# Patient Record
Sex: Female | Born: 1980 | Race: Black or African American | Hispanic: No | Marital: Single | Smoking: Current some day smoker
Health system: Southern US, Community
[De-identification: ages and names within clinical notes are randomized; demographics above are authoritative.]

## PROBLEM LIST (undated history)

## (undated) HISTORY — PX: UTERINE FIBROID SURGERY: SHX826

---

## 2005-08-20 ENCOUNTER — Emergency Department (HOSPITAL_COMMUNITY): Admission: EM | Admit: 2005-08-20 | Discharge: 2005-08-20 | Payer: Self-pay | Admitting: Emergency Medicine

## 2005-08-26 ENCOUNTER — Emergency Department (HOSPITAL_COMMUNITY): Admission: EM | Admit: 2005-08-26 | Discharge: 2005-08-26 | Payer: Self-pay | Admitting: Family Medicine

## 2014-12-18 ENCOUNTER — Emergency Department
Admission: EM | Admit: 2014-12-18 | Discharge: 2014-12-18 | Disposition: A | Discharge status: Home / Self Care | Payer: Self-pay | Attending: Student | Admitting: Student

## 2014-12-18 DIAGNOSIS — Z4802 Encounter for removal of sutures: Secondary | ICD-10-CM | POA: Insufficient documentation

## 2014-12-18 NOTE — ED Notes (Signed)
Patient presents to the ED for suture removal.  Patient had sutures placed on 7/8 to her left eyebrow.  Patient is in no obvious distress at this time.

## 2014-12-18 NOTE — ED Provider Notes (Signed)
[facility] Emergency Department Provider Note ____________________________________________  Time seen: Approximately 2:17 PM  I have reviewed the triage vital signs and the nursing notes.   HISTORY  Chief Complaint Suture / Staple Removal   HPI Sharon Schmitt is a 34 y.o. female who presents to the emergency department for suture removal. Sutures were placed in left eyebrow on 12/05/2014.   No past medical history on file.  There are no active problems to display for this patient.   No past surgical history on file.  No current outpatient prescriptions on file.  Allergies Review of patient's allergies indicates no known allergies.  No family history on file.  Social History History  Substance Use Topics  . Smoking status: Not on file  . Smokeless tobacco: Not on file  . Alcohol Use: Not on file    Review of Systems   Constitutional: No fever/chills Eyes: No visual changes. ENT: No congestion or rhinorrhea Cardiovascular: Denies chest pain. Respiratory: Denies shortness of breath. Gastrointestinal: No abdominal pain.  No nausea, no vomiting.  No diarrhea.  No constipation. Genitourinary: Negative for dysuria. Musculoskeletal: Negative for back pain. Skin: Sutures in left eyebrow Neurological: Negative for headaches, focal weakness or numbness.  10-point ROS otherwise negative.  ____________________________________________   PHYSICAL EXAM:  VITAL SIGNS: ED Triage Vitals  Enc Vitals Group     BP 12/18/14 1401 123/79 mmHg     Pulse Rate 12/18/14 1401 83     Resp 12/18/14 1401 16     Temp 12/18/14 1401 98.6 F (37 C)     Temp Source 12/18/14 1401 Oral     SpO2 12/18/14 1401 96 %     Weight 12/18/14 1401 136 lb 8 oz (61.916 kg)     Height 12/18/14 1401 5' 6.5" (1.689 m)     Head Cir --      Peak Flow --      Pain Score 12/18/14 1402 7     Pain Loc --      Pain Edu? --      Excl. in GC? --     Constitutional: Alert and  oriented. Well appearing and in no acute distress. Eyes: Conjunctivae are normal. PERRL. EOMI. Head: Atraumatic. Nose: No congestion/rhinnorhea. Mouth/Throat: Mucous membranes are moist. Neck: No stridor. Cardiovascular: Normal rate, regular rhythm.  Good peripheral circulation. Respiratory: Normal respiratory effort.   Gastrointestinal: Soft and nontender. No distention. No abdominal bruits.  Musculoskeletal: No lower extremity tenderness nor edema.  No joint effusions. Neurologic:  Normal speech and language. No gross focal neurologic deficits are appreciated. Speech is normal. No gait instability. Skin:  Rash no; Erythema no; Negative for petechiae.  Psychiatric: Mood and affect are normal. Speech and behavior are normal.  ____________________________________________   LABS (all labs ordered are listed, but only abnormal results are displayed)  Labs Reviewed - No data to display ____________________________________________  EKG   ____________________________________________  RADIOLOGY   ____________________________________________   PROCEDURES  Procedure(s) performed: Sutures removed by RN ____________________________________________   INITIAL IMPRESSION / ASSESSMENT AND PLAN / ED COURSE  Pertinent labs & imaging results that were available during my care of the patient were reviewed by me and considered in my medical decision making (see chart for details).  Patient was advised to take her antibiotic as prescribed. She was advise to return to the ER for symptoms that change or worsen if unable to schedule an appointment. ____________________________________________   FINAL CLINICAL IMPRESSION(S) / ED DIAGNOSES  Final diagnoses:  Visit for suture removal       Chinita Pester, FNP 12/18/14 1425  Gayla Doss, MD 12/18/14 1525

## 2014-12-18 NOTE — Discharge Instructions (Signed)

## 2015-04-05 ENCOUNTER — Emergency Department: Payer: Self-pay

## 2015-04-05 ENCOUNTER — Emergency Department
Admission: EM | Admit: 2015-04-05 | Discharge: 2015-04-05 | Disposition: A | Discharge status: Home / Self Care | Payer: Self-pay | Attending: Emergency Medicine | Admitting: Emergency Medicine

## 2015-04-05 ENCOUNTER — Encounter: Payer: Self-pay | Admitting: Emergency Medicine

## 2015-04-05 DIAGNOSIS — Y9389 Activity, other specified: Secondary | ICD-10-CM | POA: Insufficient documentation

## 2015-04-05 DIAGNOSIS — S20211A Contusion of right front wall of thorax, initial encounter: Secondary | ICD-10-CM | POA: Insufficient documentation

## 2015-04-05 DIAGNOSIS — Y9289 Other specified places as the place of occurrence of the external cause: Secondary | ICD-10-CM | POA: Insufficient documentation

## 2015-04-05 DIAGNOSIS — Y998 Other external cause status: Secondary | ICD-10-CM | POA: Insufficient documentation

## 2015-04-05 MED ORDER — HYDROCODONE-ACETAMINOPHEN 5-325 MG PO TABS: 1.0000 | ORAL_TABLET | ORAL | Status: DC | PRN

## 2015-04-05 MED ORDER — HYDROCODONE-ACETAMINOPHEN 5-325 MG PO TABS
2.0000 | ORAL_TABLET | Freq: Once | ORAL | Status: AC
  Administered 2015-04-05: 2 via ORAL
  Filled 2015-04-05: qty 2

## 2015-04-05 MED ORDER — NAPROXEN 500 MG PO TBEC: 500.0000 mg | DELAYED_RELEASE_TABLET | Freq: Two times a day (BID) | ORAL | Status: DC

## 2015-04-05 MED ORDER — CYCLOBENZAPRINE HCL 5 MG PO TABS: 5.0000 mg | ORAL_TABLET | Freq: Three times a day (TID) | ORAL | Status: DC | PRN

## 2015-04-05 NOTE — ED Notes (Signed)
MD at bedside. 

## 2015-04-05 NOTE — Discharge Instructions (Signed)
Chest Contusion A contusion is a deep bruise. Bruises happen when an injury causes bleeding under the skin. Signs of bruising include pain, puffiness (swelling), and discolored skin. The bruise may turn blue, purple, or yellow.  HOME CARE  Put ice on the injured area.  Put ice in a plastic bag.  Place a towel between the skin and the bag.  Leave the ice on for 15-20 minutes at a time, 03-04 times a day for the first 48 hours.  Only take medicine as told by your doctor.  Rest.  Take deep breaths (deep-breathing exercises) as told by your doctor.  Stop smoking if you smoke.  Do not lift objects over 5 pounds (2.3 kilograms) for 3 days or longer if told by your doctor. GET HELP RIGHT AWAY IF:   You have more bruising or puffiness.  You have pain that gets worse.  You have trouble breathing.  You are dizzy, weak, or pass out (faint).  You have blood in your pee (urine) or poop (stool).  You cough up or throw up (vomit) blood.  Your puffiness or pain is not helped with medicines. MAKE SURE YOU:   Understand these instructions.  Will watch your condition.  Will get help right away if you are not doing well or get worse.   This information is not intended to replace advice given to you by your health care provider. Make sure you discuss any questions you have with your health care provider.   Document Released: 11/02/2007 Document Revised: 02/08/2012 Document Reviewed: 11/07/2011 Elsevier Interactive Patient Education 2016 [place].  Rib Contusion A rib contusion is a deep bruise on your rib area. Contusions are the result of a blunt trauma that causes bleeding and injury to the tissues under the skin. A rib contusion may involve bruising of the ribs and of the skin and muscles in the area. The skin overlying the contusion may turn blue, purple, or yellow. Minor injuries will give you a painless contusion, but more severe contusions may stay painful and swollen for a  few weeks. CAUSES  A contusion is usually caused by a blow, trauma, or direct force to an area of the body. This often occurs while playing contact sports. SYMPTOMS  Swelling and redness of the injured area.  Discoloration of the injured area.  Tenderness and soreness of the injured area.  Pain with or without movement. DIAGNOSIS  The diagnosis can be made by taking a medical history and performing a physical exam. An X-ray, CT scan, or MRI may be needed to determine if there were any associated injuries, such as broken bones (fractures) or internal injuries. TREATMENT  Often, the best treatment for a rib contusion is rest. Icing or applying cold compresses to the injured area may help reduce swelling and inflammation. Deep breathing exercises may be recommended to reduce the risk of partial lung collapse and pneumonia. Over-the-counter or prescription medicines may also be recommended for pain control. HOME CARE INSTRUCTIONS   Apply ice to the injured area:  Put ice in a plastic bag.  Place a towel between your skin and the bag.  Leave the ice on for 20 minutes, 2-3 times per day.  Take medicines only as directed by your health care provider.  Rest the injured area. Avoid strenuous activity and any activities or movements that cause pain. Be careful during activities and avoid bumping the injured area.  Perform deep-breathing exercises as directed by your health care provider.  Do not lift  anything that is heavier than 5 lb (2.3 kg) until your health care provider approves.  Do not use any tobacco products, including cigarettes, chewing tobacco, or electronic cigarettes. If you need help quitting, ask your health care provider. SEEK MEDICAL CARE IF:   You have increased bruising or swelling.  You have pain that is not controlled with treatment.  You have a fever. SEEK IMMEDIATE MEDICAL CARE IF:   You have difficulty breathing or shortness of breath.  You develop a  continual cough, or you cough up thick or bloody sputum.  You feel sick to your stomach (nauseous), you throw up (vomit), or you have abdominal pain.   This information is not intended to replace advice given to you by your health care provider. Make sure you discuss any questions you have with your health care provider.   Document Released: 02/08/2001 Document Revised: 06/06/2014 Document Reviewed: 02/25/2014 Elsevier Interactive Patient Education [employer]
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
## 2015-04-05 NOTE — ED Notes (Signed)
Patient transported to X-ray 

## 2016-02-13 ENCOUNTER — Emergency Department
Admission: EM | Admit: 2016-02-13 | Discharge: 2016-02-13 | Disposition: A | Discharge status: Home / Self Care | Payer: Self-pay | Attending: Student in an Organized Health Care Education/Training Program | Admitting: Student in an Organized Health Care Education/Training Program

## 2016-02-13 ENCOUNTER — Emergency Department: Payer: Self-pay

## 2016-02-13 ENCOUNTER — Encounter: Payer: Self-pay | Admitting: Emergency Medicine

## 2016-02-13 DIAGNOSIS — R1900 Intra-abdominal and pelvic swelling, mass and lump, unspecified site: Secondary | ICD-10-CM

## 2016-02-13 DIAGNOSIS — Y9389 Activity, other specified: Secondary | ICD-10-CM | POA: Insufficient documentation

## 2016-02-13 DIAGNOSIS — W19XXXA Unspecified fall, initial encounter: Secondary | ICD-10-CM

## 2016-02-13 DIAGNOSIS — R1909 Other intra-abdominal and pelvic swelling, mass and lump: Secondary | ICD-10-CM | POA: Insufficient documentation

## 2016-02-13 DIAGNOSIS — W1839XA Other fall on same level, initial encounter: Secondary | ICD-10-CM | POA: Insufficient documentation

## 2016-02-13 DIAGNOSIS — R0789 Other chest pain: Secondary | ICD-10-CM | POA: Insufficient documentation

## 2016-02-13 DIAGNOSIS — Y9289 Other specified places as the place of occurrence of the external cause: Secondary | ICD-10-CM | POA: Insufficient documentation

## 2016-02-13 DIAGNOSIS — Y999 Unspecified external cause status: Secondary | ICD-10-CM | POA: Insufficient documentation

## 2016-02-13 DIAGNOSIS — F17200 Nicotine dependence, unspecified, uncomplicated: Secondary | ICD-10-CM | POA: Insufficient documentation

## 2016-02-13 LAB — CBC
HCT: 39.4 % (ref 35.0–47.0)
Hemoglobin: 13.5 g/dL (ref 12.0–16.0)
MCH: 31.1 pg (ref 26.0–34.0)
MCHC: 34.4 g/dL (ref 32.0–36.0)
MCV: 90.6 fL (ref 80.0–100.0)
PLATELETS: 234 10*3/uL (ref 150–440)
RBC: 4.35 MIL/uL (ref 3.80–5.20)
RDW: 13.2 % (ref 11.5–14.5)
WBC: 5.8 10*3/uL (ref 3.6–11.0)

## 2016-02-13 LAB — URINALYSIS COMPLETE WITH MICROSCOPIC (ARMC ONLY)
BILIRUBIN URINE: NEGATIVE
Bacteria, UA: NONE SEEN
GLUCOSE, UA: NEGATIVE mg/dL
Hgb urine dipstick: NEGATIVE
Ketones, ur: NEGATIVE mg/dL
Leukocytes, UA: NEGATIVE
Nitrite: NEGATIVE
Protein, ur: NEGATIVE mg/dL
Specific Gravity, Urine: 1.019 (ref 1.005–1.030)
pH: 5 (ref 5.0–8.0)

## 2016-02-13 LAB — LIPASE, BLOOD: LIPASE: 28 U/L (ref 11–51)

## 2016-02-13 LAB — COMPREHENSIVE METABOLIC PANEL
ALK PHOS: 41 U/L (ref 38–126)
ALT: 13 U/L — AB (ref 14–54)
AST: 21 U/L (ref 15–41)
Albumin: 3.7 g/dL (ref 3.5–5.0)
Anion gap: 5 (ref 5–15)
BUN: 12 mg/dL (ref 6–20)
CHLORIDE: 106 mmol/L (ref 101–111)
CO2: 25 mmol/L (ref 22–32)
CREATININE: 0.75 mg/dL (ref 0.44–1.00)
Calcium: 8.9 mg/dL (ref 8.9–10.3)
GFR calc Af Amer: >60 mL/min (ref >60)
Glucose, Bld: 99 mg/dL (ref 65–99)
Potassium: 4 mmol/L (ref 3.5–5.1)
SODIUM: 136 mmol/L (ref 135–145)
Total Bilirubin: 0.2 mg/dL — ABNORMAL LOW (ref 0.3–1.2)
Total Protein: 7.2 g/dL (ref 6.5–8.1)

## 2016-02-13 LAB — POCT PREGNANCY, URINE: Preg Test, Ur: NEGATIVE

## 2016-02-13 MED ORDER — NAPROXEN 500 MG PO TABS: 500.0000 mg | ORAL_TABLET | Freq: Two times a day (BID) | ORAL | 0 refills | Status: DC

## 2016-02-13 MED ORDER — FENTANYL CITRATE (PF) 100 MCG/2ML IJ SOLN
100.0000 ug | INTRAMUSCULAR | Status: DC | PRN
  Administered 2016-02-13: 100 ug via INTRAVENOUS
  Filled 2016-02-13: qty 2

## 2016-02-13 MED ORDER — HYDROCODONE-ACETAMINOPHEN 5-325 MG PO TABS: 1.0000 | ORAL_TABLET | ORAL | 0 refills | Status: DC | PRN

## 2016-02-13 MED ORDER — SODIUM CHLORIDE 0.9 % IV BOLUS (SEPSIS)
1000.0000 mL | Freq: Once | INTRAVENOUS | Status: AC
  Administered 2016-02-13: 1000 mL via INTRAVENOUS

## 2016-02-13 MED ORDER — PROMETHAZINE HCL 12.5 MG PO TABS: 12.5000 mg | ORAL_TABLET | Freq: Four times a day (QID) | ORAL | 0 refills | Status: DC | PRN

## 2016-02-13 MED ORDER — IOPAMIDOL (ISOVUE-300) INJECTION 61%
100.0000 mL | Freq: Once | INTRAVENOUS | Status: AC | PRN
  Administered 2016-02-13: 100 mL via INTRAVENOUS

## 2016-02-13 MED ORDER — PROMETHAZINE HCL 25 MG/ML IJ SOLN
12.5000 mg | Freq: Once | INTRAMUSCULAR | Status: AC
  Administered 2016-02-13: 12.5 mg via INTRAVENOUS
  Filled 2016-02-13: qty 1

## 2016-02-13 MED ORDER — IOPAMIDOL (ISOVUE-300) INJECTION 61%: 30.0000 mL | Freq: Once | INTRAVENOUS | Status: DC | PRN

## 2016-02-13 NOTE — ED Notes (Signed)
Pt states intense pain in posterior rib area as well as when she breathes.  No visual deformity noted, but pt states that it is tender to the touch.

## 2016-02-13 NOTE — ED Triage Notes (Signed)
Abdominal bloating x 1 year.

## 2016-02-13 NOTE — ED Triage Notes (Signed)
Fell 2 days ago, hit corner edge of couch with R side, R chest wall pain, esp with cough or inspiration. States feels like she broke a rib.

## 2016-02-13 NOTE — Discharge Instructions (Signed)
Please be sure to follow-up with her GYN doctor at [place] or at one of the local clinics. Please return for worsening pain.
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
## 2016-04-04 ENCOUNTER — Encounter: Payer: Self-pay | Admitting: Medical Oncology

## 2016-04-04 ENCOUNTER — Emergency Department
Admission: EM | Admit: 2016-04-04 | Discharge: 2016-04-04 | Disposition: A | Discharge status: Home / Self Care | Payer: Self-pay | Attending: Emergency Medicine | Admitting: Emergency Medicine

## 2016-04-04 DIAGNOSIS — L0231 Cutaneous abscess of buttock: Secondary | ICD-10-CM | POA: Insufficient documentation

## 2016-04-04 DIAGNOSIS — F17200 Nicotine dependence, unspecified, uncomplicated: Secondary | ICD-10-CM | POA: Insufficient documentation

## 2016-04-04 MED ORDER — HYDROCODONE-ACETAMINOPHEN 5-325 MG PO TABS: 1.0000 | ORAL_TABLET | ORAL | 0 refills | Status: DC | PRN

## 2016-04-04 MED ORDER — SULFAMETHOXAZOLE-TRIMETHOPRIM 800-160 MG PO TABS: 1.0000 | ORAL_TABLET | Freq: Two times a day (BID) | ORAL | 0 refills | Status: DC

## 2016-04-04 MED ORDER — LIDOCAINE HCL (PF) 1 % IJ SOLN
5.0000 mL | Freq: Once | INTRAMUSCULAR | Status: DC
  Filled 2016-04-04: qty 5

## 2016-04-04 NOTE — ED Notes (Signed)
Dry dressing applied to buttocks  Tolerated well

## 2016-04-04 NOTE — ED Provider Notes (Signed)
[facility] Emergency Department Provider Note  ____________________________________________   First MD Initiated Contact with Patient 04/04/16 [phone]     (approximate)  I have reviewed the triage vital signs and the nursing notes.   HISTORY  Chief Complaint Abscess    HPI Sharon Schmitt is a 35 y.o. female that presents with swelling and constant pain over the left buttocks that started a day and a half ago. Area is tender to touch. Patient has not noticed any drainage. The patient has not had any trauma to the area. Patient has not had anything like this before.  Patient denies fever or chills. Patient does not have any allergies.  Patient has been taking ibuprofen for pain.   No past medical history on file.  There are no active problems to display for this patient.   History reviewed. No pertinent surgical history.  Prior to Admission medications   Medication Sig Start Date End Date Taking? Authorizing Provider  HYDROcodone-acetaminophen (NORCO/VICODIN) 5-325 MG tablet Take 1 tablet by mouth every 4 (four) hours as needed for moderate pain. 04/04/16   Tommi Rumpshonda L Summers, PA-C  sulfamethoxazole-trimethoprim (BACTRIM DS,SEPTRA DS) 800-160 MG tablet Take 1 tablet by mouth 2 (two) times daily. 04/04/16   Tommi Rumpshonda L Summers, PA-C    Allergies Patient has no known allergies.  No family history on file.  Social History Social History  Substance Use Topics  . Smoking status: Current Some Day Smoker  . Smokeless tobacco: Not on file  . Alcohol use Yes    Review of Systems  Constitutional: No fever/chills/night sweats. Gastrointestinal:  No nausea, no vomiting. No diarrhea or constipation.  Skin: No other rashes. Neurological: No leg weakness or numbness. Allergic/Immunilogical: No allergies 10-point ROS otherwise negative.  ____________________________________________   PHYSICAL EXAM:  VITAL SIGNS: ED Triage Vitals  Enc Vitals Group   BP 04/04/16 0710 130/83     Pulse Rate 04/04/16 0710 89     Resp 04/04/16 0710 18     Temp 04/04/16 0710 97.6 F (36.4 C)     Temp Source 04/04/16 0710 Oral     SpO2 04/04/16 0710 97 %     Weight 04/04/16 0707 143 lb (64.9 kg)     Height 04/04/16 0707 5\' 6"  (1.676 m)     Head Circumference --      Peak Flow --      Pain Score 04/04/16 0707 8     Pain Loc --      Pain Edu? --      Excl. in GC? --     Constitutional: Alert and oriented. Well appearing and in no acute distress. Eyes: Conjunctivae are normal. PERRL. EOMI. Head: Atraumatic. Nose: No congestion/rhinnorhea. Mouth/Throat: Mucous membranes are moist.  Oropharynx non-erythematous. Neck: No stridor.   Cardiovascular: Good peripheral circulation. Respiratory: Normal respiratory effort.  No retractions.  Gastrointestinal: Soft and nontender. No distention. Musculoskeletal: No lower extremity tenderness nor edema.  No joint effusions. Neurologic:  Normal speech and language. No gross focal neurologic deficits are appreciated. No gait instability. Skin:  Skin is warm, dry and intact. 4 cm area of erythema and swelling over left buttocks. Extreme tenderness to palpation. Psychiatric: Mood and affect are normal. Speech and behavior are normal.  ____________________________________________   PROCEDURES  Procedure(s) performed: INCISION AND DRAINAGE Performed by: Tommi Rumpshonda L Summers Consent: Verbal consent obtained. Risks and benefits: risks, benefits and alternatives were discussed Type: abscess  Body area: left buttocks  Anesthesia: local infiltration  Incision  was made with a scalpel.  Local anesthetic: lidocaine 1% without epinephrine  Anesthetic total: 3 ml  Complexity: complex Blunt dissection to break up loculations  Drainage: purulent  Drainage amount: 2 ml  Packing material: 1/4 in iodoform gauze  Patient tolerance: Patient tolerated the procedure well with no immediate  complications.   Procedures  Critical Care performed: No  ____________________________________________   INITIAL IMPRESSION / ASSESSMENT AND PLAN / ED COURSE  Pertinent labs & imaging results that were available during my care of the patient were reviewed by me and considered in my medical decision making (see chart for details).    Clinical Course     Assessment: Abscess  Given the duration of symptoms and the extent of erythema, swelling, and tenderness my assessment is that the patient has an abscess. Patient denies any trauma to the area. Patient could have an insect bite but does not recall being bitten.   Plan: Abscess was be incised and drained in ED and covered with dressing. Abscess was packed with iodoform gauze, which should be removed in 2 days. Patient should take 1 tablet Bactrim twice daily for 10 days. Patient can take Vicodin 1 tablet every 4 hours as needed for pain. Return if symptoms worsen.  Enid DerryAshley Wagner PA-C   ____________________________________________   FINAL CLINICAL IMPRESSION(S) / ED DIAGNOSES  Final diagnoses:  Abscess of buttock, left      NEW MEDICATIONS STARTED DURING THIS VISIT:  Discharge Medication List as of 04/04/2016  8:57 AM    START taking these medications   Details  sulfamethoxazole-trimethoprim (BACTRIM DS,SEPTRA DS) 800-160 MG tablet Take 1 tablet by mouth 2 (two) times daily., Starting Mon 04/04/2016, Print         Note:  This document was prepared using Dragon voice recognition software and may include unintentional dictation errors.    Tommi Rumpshonda L Summers, PA-C 04/04/16 1202    Emily FilbertJonathan E Williams, MD 04/04/16 [phone]

## 2016-04-04 NOTE — Discharge Instructions (Signed)
Return to the emergency room or urgent care for removal of the drain in 2 days. Take medication as directed. Take Septra DS twice a day for 10 days. Take Norco only as needed for pain.

## 2016-04-04 NOTE — ED Triage Notes (Signed)
Pt reports abscess to left buttock x 2 days.  

## 2016-04-04 NOTE — ED Notes (Addendum)
States possible insect bite to buttocks couple of days ago  Area is larger and having increased pain .Marland Kitchen.left buttock red and swollen

## 2016-04-06 ENCOUNTER — Emergency Department
Admission: EM | Admit: 2016-04-06 | Discharge: 2016-04-06 | Disposition: A | Discharge status: Home / Self Care | Payer: Self-pay | Attending: Emergency Medicine | Admitting: Emergency Medicine

## 2016-04-06 ENCOUNTER — Encounter: Payer: Self-pay | Admitting: Emergency Medicine

## 2016-04-06 DIAGNOSIS — Z09 Encounter for follow-up examination after completed treatment for conditions other than malignant neoplasm: Secondary | ICD-10-CM

## 2016-04-06 DIAGNOSIS — F17200 Nicotine dependence, unspecified, uncomplicated: Secondary | ICD-10-CM | POA: Insufficient documentation

## 2016-04-06 DIAGNOSIS — Z5189 Encounter for other specified aftercare: Secondary | ICD-10-CM

## 2016-04-06 DIAGNOSIS — Z4800 Encounter for change or removal of nonsurgical wound dressing: Secondary | ICD-10-CM | POA: Insufficient documentation

## 2016-04-06 NOTE — ED Notes (Signed)
Here for wound recheck  Had abscess to buttocks lanced and packed 2 days ago  Packing remains intact..Marland Kitchen

## 2016-04-06 NOTE — ED Triage Notes (Signed)
Pt comes into the ED via POV for wound recheck.  Patient seen Monday for possible spider bite on left buttock.  Patient here per instructions for recheck

## 2016-04-06 NOTE — ED Provider Notes (Signed)
[facility]-EMERGENCY DEPARTMENT Provider Note   CSN: [id number] Arrival date & time: 04/06/16  1634     History   Chief Complaint Chief Complaint  Patient presents with  . Wound Check    HPI Sharon Schmitt is a 35 y.o. female that presents for a wound recheck after having an abscess drained on Monday. Swelling has decreased. Patient states the wound is feeling better but is still having pain. Patient has been taking antibiotics. Patient was only able to fill half of her pain medications due to cost. Patient has been supplementing pain medication with ibuprofen and tylenol. Patient is changing dressing 2 times a day and states that there is still some yellowish drainage.   Patient is having some left leg numbness. This started at the same time abscess developed. Numbness comes and goes and is not related to position. Patient is trying to move leg and stay active. No shooting pains or tingling.   HPI  History reviewed. No pertinent past medical history.  There are no active problems to display for this patient.   History reviewed. No pertinent surgical history.  OB History    No data available       Home Medications    Prior to Admission medications   Medication Sig Start Date End Date Taking? Authorizing Provider  HYDROcodone-acetaminophen (NORCO/VICODIN) 5-325 MG tablet Take 1 tablet by mouth every 4 (four) hours as needed for moderate pain. 04/04/16   Tommi Rumpshonda L Summers, PA-C  sulfamethoxazole-trimethoprim (BACTRIM DS,SEPTRA DS) 800-160 MG tablet Take 1 tablet by mouth 2 (two) times daily. 04/04/16   Tommi Rumpshonda L Summers, PA-C    Family History No family history on file.  Social History Social History  Substance Use Topics  . Smoking status: Current Some Day Smoker  . Smokeless tobacco: Never Used  . Alcohol use Yes     Allergies   Patient has no known allergies.   Review of Systems Review of Systems  Constitutional: Negative for chills and fever.  Genitourinary:  Negative for difficulty urinating.  Musculoskeletal: Negative for arthralgias, back pain, gait problem and joint swelling.  Skin: Negative for color change, pallor and rash.     Physical Exam Updated Vital Signs BP (!) 141/83 (BP Location: Left Arm)   Pulse 86   Temp 97.5 F (36.4 C) (Oral)   Resp 18   Ht 5\' 6"  (1.676 m)   Wt 67.2 kg   SpO2 100%   BMI 23.90 kg/m   Physical Exam  Constitutional: She is oriented to person, place, and time. She appears well-developed and well-nourished.  HENT:  Head: Normocephalic and atraumatic.  Eyes: Conjunctivae and EOM are normal. Pupils are equal, round, and reactive to light.  Neck: Normal range of motion. Neck supple.  Pulmonary/Chest: Effort normal.  Abdominal: Soft.  Musculoskeletal: Normal range of motion.  Neurological: She is alert and oriented to person, place, and time.  Skin: Skin is warm and dry.  Minimal yellow drainage from incision on left buttocks. Minimal swelling around incision.  Psychiatric: She has a normal mood and affect. Her behavior is normal. Judgment and thought content normal.     ED Treatments / Results  Labs (all labs ordered are listed, but only abnormal results are displayed) Labs Reviewed - No data to display   Procedures Wound packing Date/Time: 04/06/2016 5:38 PM Performed by: Enid DerryWAGNER, Mylez Venable Authorized by: Enid DerryWAGNER, Moselle Rister  Consent: Verbal consent obtained. Risks and benefits: risks, benefits and alternatives were discussed Consent given by: patient Patient understanding:  patient states understanding of the procedure being performed Patient consent: the patient's understanding of the procedure matches consent given    (including critical care time)  Medications Ordered in ED Medications - No data to display   Initial Impression / Assessment and Plan / ED Course  I have reviewed the triage vital signs and the nursing notes.  Pertinent labs & imaging results that were available during my  care of the patient were reviewed by me and considered in my medical decision making (see chart for details).  Clinical Course    Assessment and Plan  Wound appears significantly better than Monday. Swelling and pain are reduced and patient has not had a fever or chills. I expect numbness to improve as patient begins walking and moving left leg more.   Wound was repacked and patient was instructed to return in 2 days for packing removal. Continue antibiotics. Continue changing dressing 2x day. Keep wound dry. Use warm packs as needed. Supplement pain medication with tylenol and ibuprofen.   Final Clinical Impressions(s) / ED Diagnoses   Final diagnoses:  Wound check, abscess  Encounter for recheck of abscess following incision and drainage    New Prescriptions New Prescriptions   No medications on file     Enid Derryshley Kresha Abelson, PA-C 04/06/16 1751    Jene Everyobert Kinner, MD 04/06/16 [phone]

## 2016-04-06 NOTE — Discharge Instructions (Signed)
Continue antibiotics. Continue changing dressing 2x day. Keep wound dry. Use warm packs as needed around site. Supplement pain medication with tylenol and ibuprofen.

## 2016-04-08 ENCOUNTER — Encounter: Payer: Self-pay | Admitting: Emergency Medicine

## 2016-04-08 ENCOUNTER — Emergency Department: Payer: Self-pay

## 2016-04-08 ENCOUNTER — Emergency Department
Admission: EM | Admit: 2016-04-08 | Discharge: 2016-04-08 | Disposition: A | Discharge status: Home / Self Care | Payer: Self-pay | Attending: Emergency Medicine | Admitting: Emergency Medicine

## 2016-04-08 DIAGNOSIS — L0291 Cutaneous abscess, unspecified: Secondary | ICD-10-CM

## 2016-04-08 DIAGNOSIS — F17200 Nicotine dependence, unspecified, uncomplicated: Secondary | ICD-10-CM | POA: Insufficient documentation

## 2016-04-08 DIAGNOSIS — L0231 Cutaneous abscess of buttock: Secondary | ICD-10-CM | POA: Insufficient documentation

## 2016-04-08 DIAGNOSIS — Z5189 Encounter for other specified aftercare: Secondary | ICD-10-CM

## 2016-04-08 MED ORDER — LIDOCAINE-EPINEPHRINE (PF) 1 %-1:200000 IJ SOLN
30.0000 mL | Freq: Once | INTRAMUSCULAR | Status: AC
  Administered 2016-04-08: 5 mL via INTRADERMAL

## 2016-04-08 MED ORDER — CLINDAMYCIN HCL 150 MG PO CAPS: 450.0000 mg | ORAL_CAPSULE | Freq: Four times a day (QID) | ORAL | 0 refills | Status: DC

## 2016-04-08 MED ORDER — LIDOCAINE-EPINEPHRINE 2 %-1:100000 IJ SOLN: 30.0000 mL | Freq: Once | INTRAMUSCULAR | Status: DC

## 2016-04-08 MED ORDER — HYDROMORPHONE HCL 1 MG/ML IJ SOLN
1.0000 mg | Freq: Once | INTRAMUSCULAR | Status: AC
  Administered 2016-04-08: 1 mg via INTRAMUSCULAR
  Filled 2016-04-08: qty 1

## 2016-04-08 MED ORDER — HYDROCODONE-ACETAMINOPHEN 5-325 MG PO TABS: 1.0000 | ORAL_TABLET | Freq: Four times a day (QID) | ORAL | 0 refills | Status: DC | PRN

## 2016-04-08 NOTE — Discharge Instructions (Signed)
Please change dressing daily. Return to the ER for any fevers worsening symptoms or urgent changes in her health. Follow-up with primary care physician, walk-in clinic or emergency department in 2-3 days for a recheck. Discontinue Bactrim and start clindamycin.

## 2016-04-08 NOTE — ED Provider Notes (Addendum)
[facility] Emergency Department Provider Note  ____________________________________________   I have reviewed the triage vital signs and the nursing notes.   HISTORY  Chief Complaint Wound Check    HPI Sharon Schmitt is a 35 y.o. female  Who presents today for a wound check she had an abscess I and D at this facility on 11/6.  Sent home on bactrim. Patient has been compliant with the Bactrim. She has had no systemic illness such as fevers etc. but she has ongoing pain there. The dressing dropped out including the packing. She is here for wound check.      History reviewed. No pertinent past medical history.  There are no active problems to display for this patient.   History reviewed. No pertinent surgical history.  Prior to Admission medications   Medication Sig Start Date End Date Taking? Authorizing Provider  HYDROcodone-acetaminophen (NORCO/VICODIN) 5-325 MG tablet Take 1 tablet by mouth every 4 (four) hours as needed for moderate pain. 04/04/16   Tommi Rumpshonda L Summers, PA-C  sulfamethoxazole-trimethoprim (BACTRIM DS,SEPTRA DS) 800-160 MG tablet Take 1 tablet by mouth 2 (two) times daily. 04/04/16   Tommi Rumpshonda L Summers, PA-C    Allergies Patient has no known allergies.  History reviewed. No pertinent family history.  Social History Social History  Substance Use Topics  . Smoking status: Current Some Day Smoker  . Smokeless tobacco: Never Used  . Alcohol use Yes    Review of Systems Constitutional: No fever/chills Eyes: No visual changes. ENT: No sore throat. No stiff neck no neck pain Cardiovascular: Denies chest pain. Respiratory: Denies shortness of breath. Gastrointestinal:   no vomiting.  No diarrhea.  No constipation. Genitourinary: Negative for dysuria. Musculoskeletal: Negative lower extremity swelling Skin: Negative for rash Neurological: Negative for severe headaches, focal weakness or numbness. 10-point ROS otherwise  negative.  ____________________________________________   PHYSICAL EXAM:  VITAL SIGNS: ED Triage Vitals  Enc Vitals Group     BP 04/08/16 1601 121/76     Pulse Rate 04/08/16 1601 84     Resp 04/08/16 1601 18     Temp 04/08/16 1601 97.8 F (36.6 C)     Temp Source 04/08/16 1601 Oral     SpO2 04/08/16 1601 99 %     Weight 04/08/16 1602 149 lb (67.6 kg)     Height --      Head Circumference --      Peak Flow --      Pain Score 04/08/16 1602 7     Pain Loc --      Pain Edu? --      Excl. in GC? --     Constitutional: Alert and oriented. Well appearing and in no acute distress.   Musculoskeletal: No lower extremity tenderness, no upper extremity tenderness. No joint effusions, no DVT signs strong distal pulses no edema Neurologic:  Normal speech and language. No gross focal neurologic deficits are appreciated.  Skin:  Skin is warm, dry and intact.There is a purulentdraining area approximately 1.5 cm to the left buttock with surrounding induration but no cellulitic changes. Psychiatric: Mood and affect are normal. Speech and behavior are normal.  ____________________________________________   LABS (all labs ordered are listed, but only abnormal results are displayed)  Labs Reviewed - No data to display ____________________________________________  EKG  I personally interpreted any EKGs ordered by me or triage  ____________________________________________  RADIOLOGY  I reviewed any imaging ordered by me or triage that were performed during my shift and,  if possible, patient and/or family made aware of any abnormal findings. ____________________________________________   PROCEDURES  Procedure(s) performed: None  Procedures  Critical Care performed: None  ____________________________________________   INITIAL IMPRESSION / ASSESSMENT AND PLAN / ED COURSE  Pertinent labs & imaging results that were available during my care of the patient were reviewed by me  and considered in my medical decision making (see chart for details).  Patient still quite tender there is still some induration, it is not clear if this is simply residual changes from the abscess or a reaccumulation. We will send her for ultrasound for further evaluation. I am giving her pain medication.  No  Evidence of systemic toxicity or infection. Signed out at the end of my shift to [place] PA   Clinical Course    ____________________________________________   FINAL CLINICAL IMPRESSION(S) / ED DIAGNOSES  Final diagnoses:  Abscess      This chart was dictated using voice recognition software.  Despite best efforts to proofread,  errors can occur which can change meaning.      Jeanmarie PlantJames A Lamari Youngers, MD 04/08/16 1737    Jeanmarie PlantJames A Lainy Wrobleski, MD 04/08/16 [phone]

## 2016-04-08 NOTE — ED Triage Notes (Signed)
Pt to ed for recheck of bite and abscess to buttock.

## 2016-04-08 NOTE — ED Provider Notes (Signed)
[facility]-EMERGENCY DEPARTMENT Provider Note   CSN: [id number] Arrival date & time: 04/08/16  1549     History   Chief Complaint Chief Complaint  Patient presents with  . Wound Check    HPI Sharon Schmitt is a 35 y.o. female is here for recheck of her left buttocks abscess. Was seen 4 days ago, had incision and drainage with iodoform packing. Bactrim DS was prescribed. The last 2 days she's had continued pain and swelling of the left buttocks. Patient's pain is moderate. Chest pain with sitting. No fevers, mild drainage.  HPI  History reviewed. No pertinent past medical history.  There are no active problems to display for this patient.   History reviewed. No pertinent surgical history.  OB History    No data available       Home Medications    Prior to Admission medications   Medication Sig Start Date End Date Taking? Authorizing Provider  clindamycin (CLEOCIN) 150 MG capsule Take 3 capsules (450 mg total) by mouth 4 (four) times daily. 04/08/16   Evon Slackhomas C Tyrihanna Wingert, PA-C  HYDROcodone-acetaminophen (NORCO) 5-325 MG tablet Take 1 tablet by mouth every 6 (six) hours as needed for moderate pain. 04/08/16   Evon Slackhomas C Sahirah Rudell, PA-C    Family History History reviewed. No pertinent family history.  Social History Social History  Substance Use Topics  . Smoking status: Current Some Day Smoker  . Smokeless tobacco: Never Used  . Alcohol use Yes     Allergies   Patient has no known allergies.   Review of Systems Review of Systems  Constitutional: Negative for activity change, chills, fatigue and fever.  Respiratory: Negative for shortness of breath.   Cardiovascular: Negative for chest pain and leg swelling.  Gastrointestinal: Negative for abdominal pain, diarrhea, nausea and vomiting.  Genitourinary: Negative for dysuria.  Musculoskeletal: Negative for arthralgias and gait problem.  Skin: Positive for wound. Negative for rash.  Neurological: Negative for  weakness, numbness and headaches.  Hematological: Negative for adenopathy.  Psychiatric/Behavioral: Negative for agitation, behavioral problems and confusion.     Physical Exam Updated Vital Signs BP 138/79 (BP Location: Right Arm)   Pulse 76   Temp 97.6 F (36.4 C) (Oral)   Resp 16   Wt 67.6 kg   LMP 03/28/2016   SpO2 100%   BMI 24.05 kg/m   Physical Exam  Constitutional: She is oriented to person, place, and time. She appears well-developed and well-nourished. No distress.  HENT:  Head: Normocephalic and atraumatic.  Mouth/Throat: Oropharynx is clear and moist.  Eyes: EOM are normal. Right eye exhibits no discharge. Left eye exhibits no discharge.  Neck: Normal range of motion. Neck supple.  Cardiovascular: Normal rate, regular rhythm and intact distal pulses.   Pulmonary/Chest: Effort normal and breath sounds normal. No respiratory distress. She exhibits no tenderness.  Abdominal: Soft. She exhibits no distension. There is no tenderness.  Musculoskeletal: Normal range of motion. She exhibits no edema.  Neurological: She is alert and oriented to person, place, and time. She has normal reflexes.  Skin: Skin is warm and dry.  Examination of the left buttock shows patient has a 1.5 cm opening with mild purulent drainage. There is 4-5 cm of surrounding deep induration. She is tender to palpation.  Psychiatric: She has a normal mood and affect. Her behavior is normal. Thought content normal.     ED Treatments / Results  Labs (all labs ordered are listed, but only abnormal results are displayed) Labs Reviewed  AEROBIC CULTURE (SUPERFICIAL SPECIMEN)    EKG  EKG Interpretation None       Radiology [place] Pelvis Limited  Result Date: 04/08/2016 CLINICAL DATA:  Patient with draining abscess in the left buttock for 7 days. EXAM: LIMITED ULTRASOUND OF PELVIS TECHNIQUE: Limited transabdominal ultrasound examination of the pelvis was performed. COMPARISON:  CT abdomen pelvis  02/13/2016. FINDINGS: At the area of drainage within the left buttock there is a 2.5 x 0.8 cm mixed echogenicity subcutaneous collection suggestive of fluid and phlegmonous change. IMPRESSION: Hypoechoic collection within the soft tissues underlying the area of drainage within the left buttock suggestive of abscess formation/ phlegmonous change. Electronically Signed   By: Annia Beltrew  Davis M.D.   On: 04/08/2016 19:40    Procedures Procedures (including critical care time) INCISION AND DRAINAGE Performed by: Patience MuscaGAINES, Nilani Hugill CHRISTOPHER Consent: Verbal consent obtained. Risks and benefits: risks, benefits and alternatives were discussed Type: abscess  Body area: Left buttocks  Anesthesia: local infiltration  Incision was made with a scalpel.  Local anesthetic: lidocaine 1 % with epinephrine  Anesthetic total: 5 ml  Complexity: complex Blunt dissection to break up loculations  Drainage: purulent  Drainage amount: Moderate   Packing material: 1/4 in iodoform gauze  Patient tolerance: Patient tolerated the procedure well with no immediate complications.     Medications Ordered in ED Medications  HYDROmorphone (DILAUDID) injection 1 mg (1 mg Intramuscular Given 04/08/16 1702)  lidocaine-EPINEPHrine (XYLOCAINE-EPINEPHrine) 1 %-1:200000 (PF) injection 30 mL (5 mLs Intradermal Given by Other 04/08/16 1751)     Initial Impression / Assessment and Plan / ED Course  I have reviewed the triage vital signs and the nursing notes.  Pertinent labs & imaging results that were available during my care of the patient were reviewed by me and considered in my medical decision making (see chart for details).  Clinical Course     35 year old female with incision and drainage 4 days ago for left buttocks abscess. Patient had incision and drainage. She continued to have pain and swelling. Today, ultrasound was performed. Deeper incision and drainage was performed. Moderate purulent drainage is  expressed from the abscess and iodoform packing was reapplied. Cultures were obtained and antibiotic was switched from Bactrim to clindamycin. Patient will follow-up in 3 days for recheck. She is educated on signs and symptoms return to the emergency department for.  Final Clinical Impressions(s) / ED Diagnoses   Final diagnoses:  Abscess  Abscess re-check    New Prescriptions Discharge Medication List as of 04/08/2016  6:15 PM    START taking these medications   Details  clindamycin (CLEOCIN) 150 MG capsule Take 3 capsules (450 mg total) by mouth 4 (four) times daily., Starting Fri 04/08/2016, Print         Evon Slackhomas C Halimah Bewick, PA-C 04/08/16 1938    Evon Slackhomas C Chaitra Mast, PA-C 04/08/16 2003    Minna AntisKevin Paduchowski, MD 04/08/16 2255

## 2016-04-11 LAB — AEROBIC CULTURE  (SUPERFICIAL SPECIMEN)

## 2016-04-11 LAB — AEROBIC CULTURE W GRAM STAIN (SUPERFICIAL SPECIMEN): Special Requests: NORMAL

## 2016-04-12 ENCOUNTER — Emergency Department
Admission: EM | Admit: 2016-04-12 | Discharge: 2016-04-12 | Disposition: A | Discharge status: Home / Self Care | Payer: Self-pay | Attending: Emergency Medicine | Admitting: Emergency Medicine

## 2016-04-12 DIAGNOSIS — Z4800 Encounter for change or removal of nonsurgical wound dressing: Secondary | ICD-10-CM | POA: Insufficient documentation

## 2016-04-12 DIAGNOSIS — Z5189 Encounter for other specified aftercare: Secondary | ICD-10-CM

## 2016-04-12 DIAGNOSIS — F17200 Nicotine dependence, unspecified, uncomplicated: Secondary | ICD-10-CM | POA: Insufficient documentation

## 2016-04-12 NOTE — ED Triage Notes (Signed)
Patient presents to the ED for a recheck of an area on her left buttock that was lanced several days ago.  Patient ambulatory to triage.  Denies increased pain.  No obvious distress at this time.

## 2016-04-12 NOTE — Discharge Instructions (Signed)
Clean daily with mild soap and water. May also use warm compresses frequently to the area. Continue taking your antibiotics until completely finished. Follow-up with one of clinics listed on the papers that your were given. He will need to call and make an appointment.
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
## 2016-04-21 ENCOUNTER — Emergency Department
Admission: EM | Admit: 2016-04-21 | Discharge: 2016-04-21 | Disposition: A | Discharge status: Home / Self Care | Payer: No Typology Code available for payment source | Attending: Emergency Medicine | Admitting: Emergency Medicine

## 2016-04-21 ENCOUNTER — Encounter: Payer: Self-pay | Admitting: Emergency Medicine

## 2016-04-21 DIAGNOSIS — S233XXA Sprain of ligaments of thoracic spine, initial encounter: Secondary | ICD-10-CM | POA: Insufficient documentation

## 2016-04-21 DIAGNOSIS — Y999 Unspecified external cause status: Secondary | ICD-10-CM | POA: Insufficient documentation

## 2016-04-21 DIAGNOSIS — Y939 Activity, unspecified: Secondary | ICD-10-CM | POA: Diagnosis not present

## 2016-04-21 DIAGNOSIS — F17200 Nicotine dependence, unspecified, uncomplicated: Secondary | ICD-10-CM | POA: Diagnosis not present

## 2016-04-21 DIAGNOSIS — S299XXA Unspecified injury of thorax, initial encounter: Secondary | ICD-10-CM | POA: Diagnosis present

## 2016-04-21 DIAGNOSIS — Y92410 Unspecified street and highway as the place of occurrence of the external cause: Secondary | ICD-10-CM | POA: Diagnosis not present

## 2016-04-21 DIAGNOSIS — S239XXA Sprain of unspecified parts of thorax, initial encounter: Secondary | ICD-10-CM

## 2016-04-21 MED ORDER — CYCLOBENZAPRINE HCL 5 MG PO TABS: 5.0000 mg | ORAL_TABLET | Freq: Three times a day (TID) | ORAL | 0 refills | Status: DC | PRN
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
## 2018-06-23 IMAGING — US US PELVIS LIMITED
1 series · 12 of 12 positions shown · non-contrast
Comparison: CT abdomen pelvis 02/13/2016.

CLINICAL DATA: Patient with draining abscess in the left buttock
for 7 days.

EXAM:
LIMITED ULTRASOUND OF PELVIS
TECHNIQUE: Limited transabdominal ultrasound examination of the pelvis was
performed.

[Series 1: us pelvis limited · 0.08mm/px · 12 acquisitions, 12 frames shown]
[im 1/12]
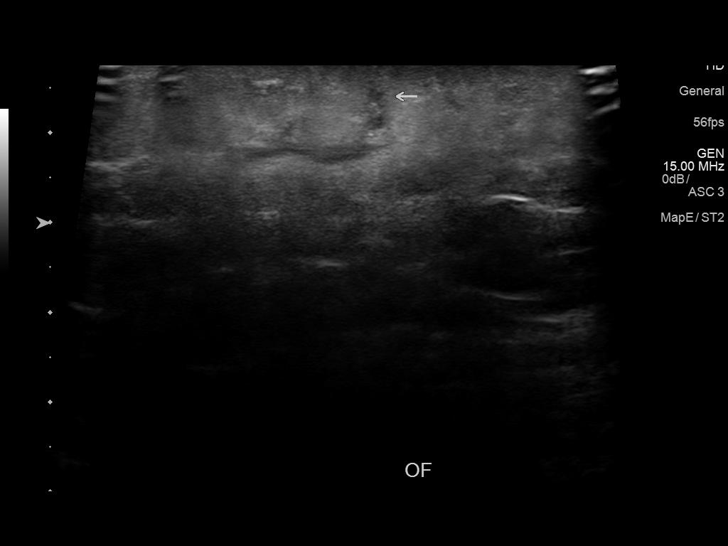
[im 2/12]
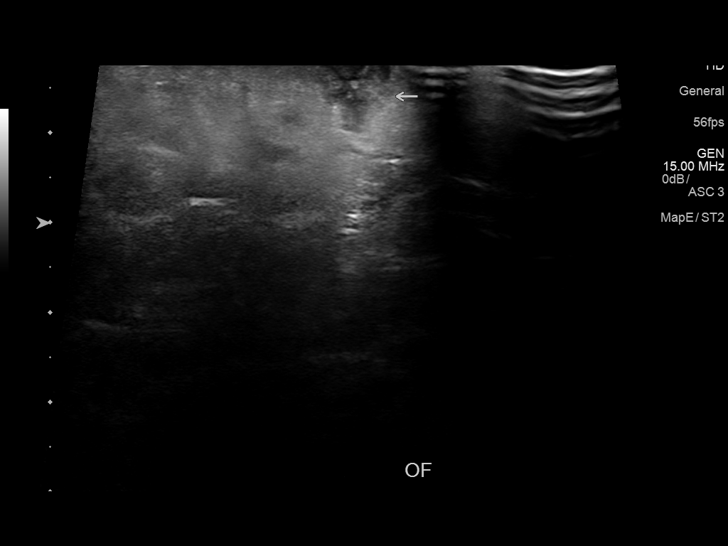
[im 3/12]
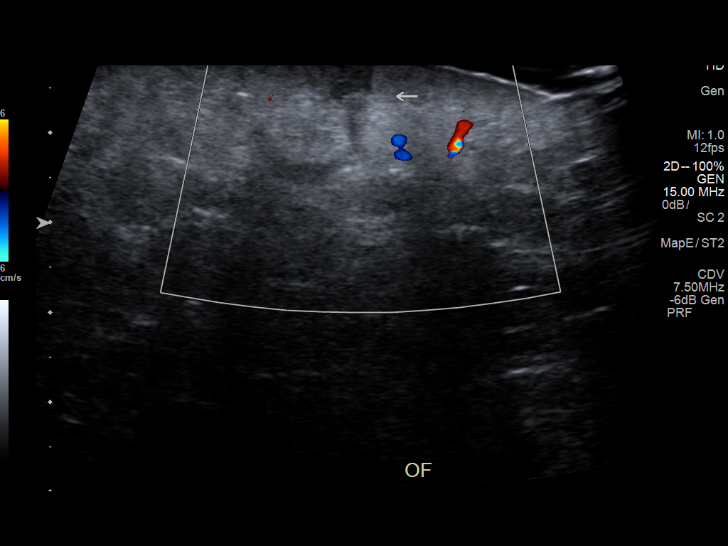
[im 4/12]
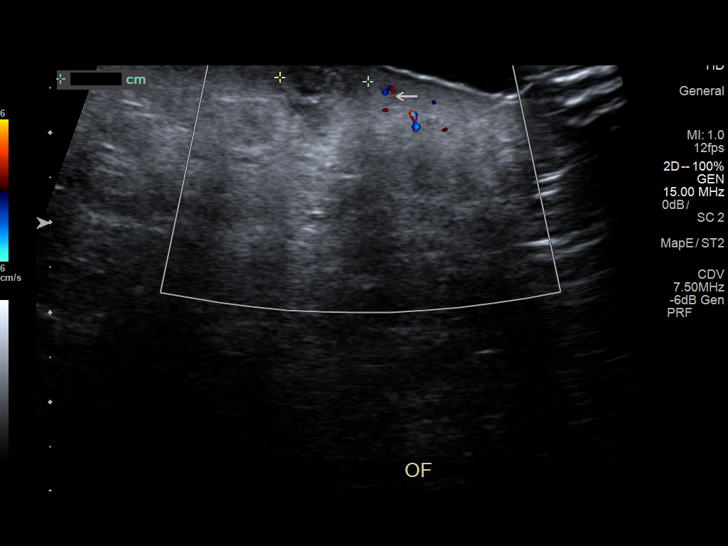
[im 5/12]
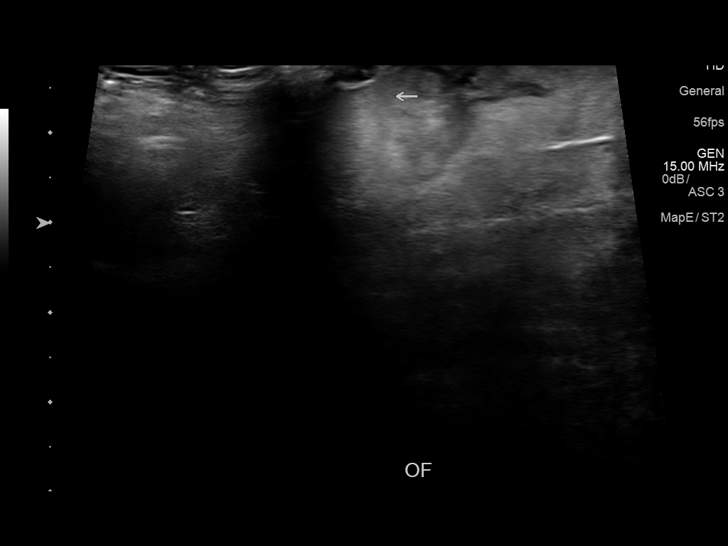
[im 6/12]
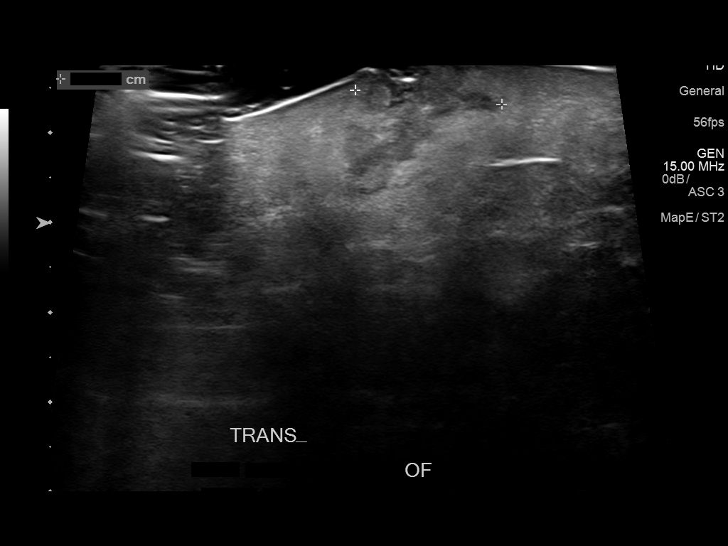
[im 7/12]
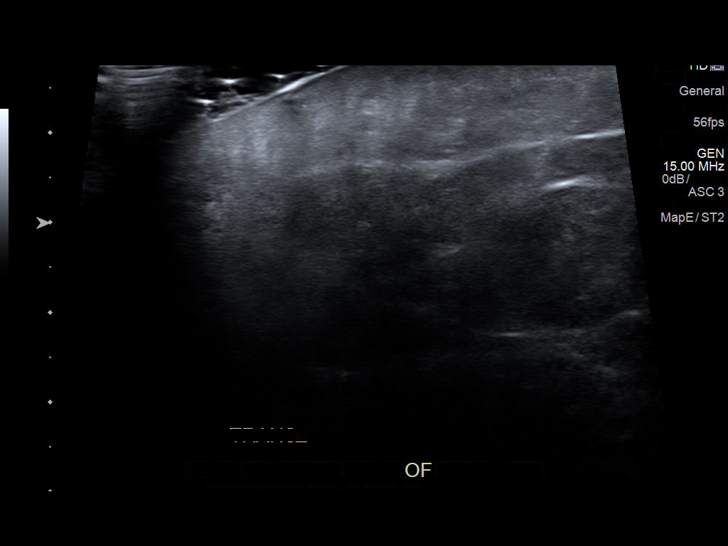
[im 8/12]
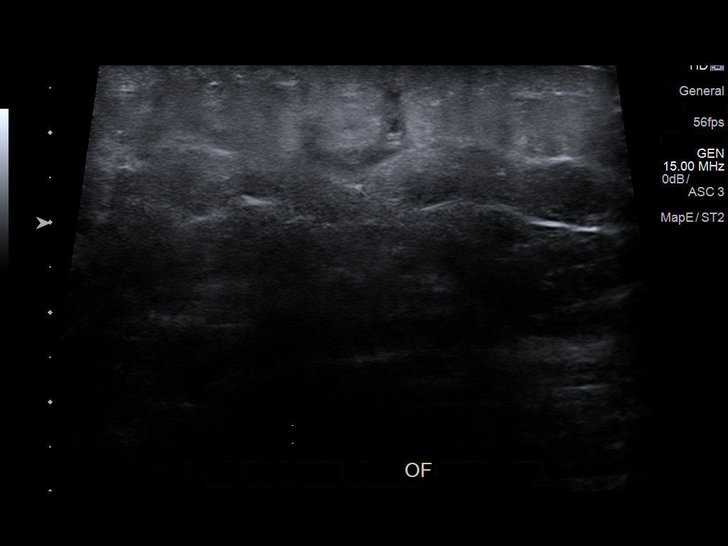
[im 9/12]
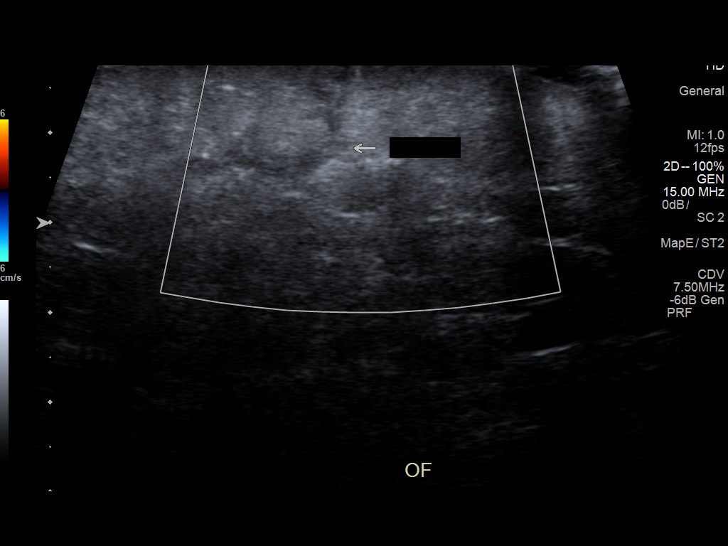
[im 10/12]
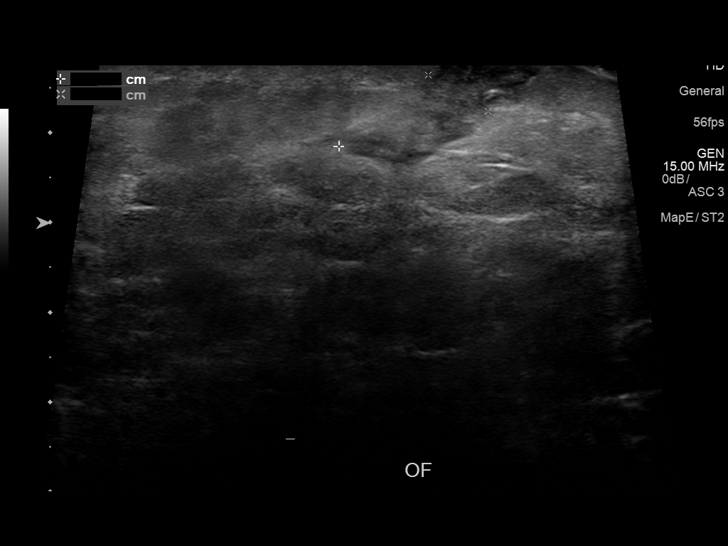
[im 11/12]
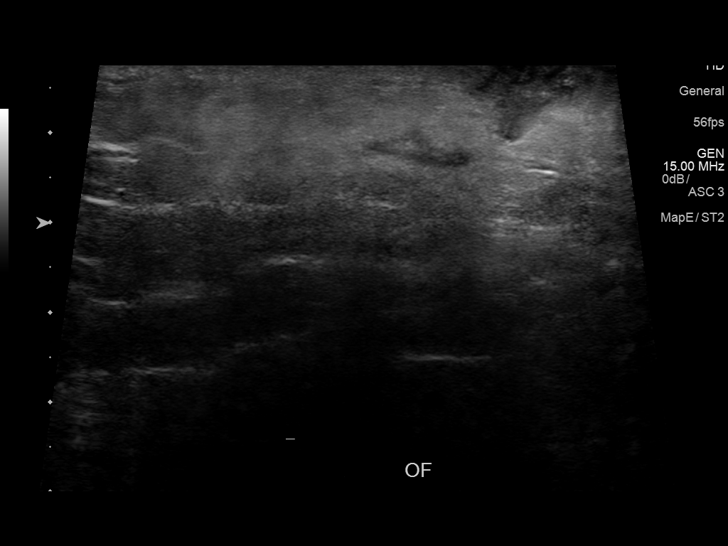
[im 12/12]
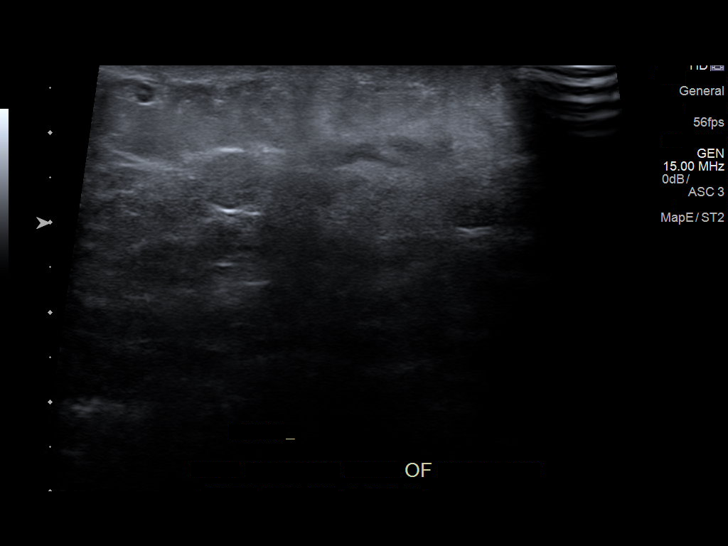

[12 of 12 positions shown; findings below may reference images not displayed]

FINDINGS: At the area of drainage within the left buttock there is a 2.5 x
cm mixed echogenicity subcutaneous collection suggestive of fluid
and phlegmonous change.
IMPRESSION: Hypoechoic collection within the soft tissues underlying the area of
drainage within the left buttock suggestive of abscess formation/
phlegmonous change.

## 2019-08-22 ENCOUNTER — Emergency Department: Payer: No Typology Code available for payment source

## 2019-08-22 ENCOUNTER — Emergency Department
Admission: EM | Admit: 2019-08-22 | Discharge: 2019-08-22 | Disposition: A | Discharge status: Home / Self Care | Payer: No Typology Code available for payment source | Attending: Emergency Medicine | Admitting: Emergency Medicine

## 2019-08-22 ENCOUNTER — Other Ambulatory Visit: Payer: Self-pay

## 2019-08-22 DIAGNOSIS — S01511A Laceration without foreign body of lip, initial encounter: Secondary | ICD-10-CM | POA: Diagnosis not present

## 2019-08-22 DIAGNOSIS — Y9389 Activity, other specified: Secondary | ICD-10-CM | POA: Diagnosis not present

## 2019-08-22 DIAGNOSIS — Y998 Other external cause status: Secondary | ICD-10-CM | POA: Insufficient documentation

## 2019-08-22 DIAGNOSIS — Y92018 Other place in single-family (private) house as the place of occurrence of the external cause: Secondary | ICD-10-CM | POA: Diagnosis not present

## 2019-08-22 DIAGNOSIS — F17200 Nicotine dependence, unspecified, uncomplicated: Secondary | ICD-10-CM | POA: Diagnosis not present

## 2019-08-22 DIAGNOSIS — M7918 Myalgia, other site: Secondary | ICD-10-CM | POA: Insufficient documentation

## 2019-08-22 DIAGNOSIS — S0990XA Unspecified injury of head, initial encounter: Secondary | ICD-10-CM | POA: Diagnosis present

## 2019-08-22 MED ORDER — MELOXICAM 15 MG PO TABS: 15.0000 mg | ORAL_TABLET | Freq: Every day | ORAL | 1 refills | Status: AC

## 2019-08-22 MED ORDER — CHLORHEXIDINE GLUCONATE 0.12 % MT SOLN: 15.0000 mL | Freq: Two times a day (BID) | OROMUCOSAL | 0 refills | Status: DC

## 2019-08-22 MED ORDER — HYDROCODONE-ACETAMINOPHEN 5-325 MG PO TABS
1.0000 | ORAL_TABLET | Freq: Once | ORAL | Status: AC
  Administered 2019-08-22: 21:00:00 1 via ORAL
  Filled 2019-08-22: qty 1

## 2019-08-22 MED ORDER — ONDANSETRON 4 MG PO TBDP
4.0000 mg | ORAL_TABLET | Freq: Once | ORAL | Status: AC
  Administered 2019-08-22: 4 mg via ORAL
  Filled 2019-08-22: qty 1

## 2019-08-22 MED ORDER — CEPHALEXIN 500 MG PO CAPS: 500.0000 mg | ORAL_CAPSULE | Freq: Three times a day (TID) | ORAL | 0 refills | Status: AC
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
## 2019-08-22 NOTE — Discharge Instructions (Signed)
Use prescribed mouthwash twice daily. Take Keflex 3 times a day for the next week. Meloxicam can be taken for pain and inflammation.

## 2019-09-25 ENCOUNTER — Encounter: Payer: Self-pay | Admitting: *Deleted

## 2019-09-25 ENCOUNTER — Emergency Department: Payer: No Typology Code available for payment source

## 2019-09-25 ENCOUNTER — Emergency Department
Admission: EM | Admit: 2019-09-25 | Discharge: 2019-09-25 | Disposition: A | Discharge status: Home / Self Care | Payer: No Typology Code available for payment source | Attending: Student in an Organized Health Care Education/Training Program | Admitting: Student in an Organized Health Care Education/Training Program

## 2019-09-25 ENCOUNTER — Other Ambulatory Visit: Payer: Self-pay

## 2019-09-25 DIAGNOSIS — Y999 Unspecified external cause status: Secondary | ICD-10-CM | POA: Diagnosis not present

## 2019-09-25 DIAGNOSIS — F17210 Nicotine dependence, cigarettes, uncomplicated: Secondary | ICD-10-CM | POA: Diagnosis not present

## 2019-09-25 DIAGNOSIS — Z79899 Other long term (current) drug therapy: Secondary | ICD-10-CM | POA: Insufficient documentation

## 2019-09-25 DIAGNOSIS — Y93I9 Activity, other involving external motion: Secondary | ICD-10-CM | POA: Diagnosis not present

## 2019-09-25 DIAGNOSIS — Y92488 Other paved roadways as the place of occurrence of the external cause: Secondary | ICD-10-CM | POA: Diagnosis not present

## 2019-09-25 DIAGNOSIS — R519 Headache, unspecified: Secondary | ICD-10-CM | POA: Diagnosis present

## 2019-09-25 MED ORDER — CYCLOBENZAPRINE HCL 5 MG PO TABS: ORAL_TABLET | ORAL | 0 refills | Status: DC

## 2019-09-25 MED ORDER — KETOROLAC TROMETHAMINE 30 MG/ML IJ SOLN: 30.0000 mg | Freq: Once | INTRAMUSCULAR | Status: DC

## 2019-09-25 MED ORDER — IBUPROFEN 600 MG PO TABS: 600.0000 mg | ORAL_TABLET | Freq: Four times a day (QID) | ORAL | 0 refills | Status: DC | PRN

## 2019-09-25 MED ORDER — TRAMADOL HCL 50 MG PO TABS
50.0000 mg | ORAL_TABLET | Freq: Once | ORAL | Status: AC
Start: 2019-09-25 — End: 2019-09-25
  Administered 2019-09-25: 50 mg via ORAL
  Filled 2019-09-25: qty 1
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
## 2020-04-21 NOTE — ED Triage Notes (Signed)
 Patient here with runny nose, reports positive covid exposure. Requesting covid test.

## 2020-04-21 NOTE — ED Provider Notes (Signed)
 ED COVID SCREENING NOTE  [facility] EM[facility]ENCY DEPARTMENT  Time of Arrival:   04/21/20 1406   (Z20.822) Close exposure to COVID-19 virus  (Z20.822) Person under investigation for COVID-19  Assessment/Plan:  DC Home with self quarantine instructions.  Personal Protective Equipment was used including;  goggles, mask-surgical and hands-gloves.  Patient was placed on droplet precaution(s).  Patient was masked   New Prescriptions   BENZONATATE (TESSALON PERLES) 100 MG PO CAPS    Take 1 capsule 3 times daily as needed for coughing, swallow capsules whole.    Chief Complaint:  URI symptoms.  Concerned for COVID infection   HPI:  Sharon Schmitt is a 39 y.o. female who presents with: congestion and cough .  Patient has past medical history significant for: none.  Patient reports: known COVID exposure and no recent travel.  Patient reports: not having either the COVID or flu vaccine this year.  ROS:  The patient denies: SOB, chest pain, fever, nausea, vomiting and diarrhea  Past/Family/Social History: Past Medical History:  Diagnosis Date  . Exercise tolerance finding 01/2017   up 2 flights of stairs no cp, no sob    Past Surgical History:  Procedure Laterality Date  . HAND/ FINGER - FRACTURE/ DISLOCATION Right 02/21/2017   Procedure: OPEN TREATMENT ARTICULAR FRACTURE PHALANGEAL JOINT 1 FINGER;  Surgeon: Phebe Charlena LABOR, MD   Social History   Tobacco Use  . Smoking status: Current Some Day Smoker    Packs/day: 0.50    Years: 10.00    Pack years: 5.00  . Smokeless tobacco: Never Used  . Tobacco comment: 4 CIGARERTES A DAY   Substance Use Topics  . Alcohol use: Yes    Comment: socially  . Drug use: No    No outpatient medications have been marked as taking for the 04/21/20 encounter [facility] Encounter).   No Known Allergies  Exam:  Vital Signs: Patient Vitals for the past 24 hrs:  Temp Heart Rate Resp BP BP Mean SpO2 Weight  04/21/20 1415 98.5 F (36.9 C) 88 18  140/71 94 MM HG 98 % 64 kg (141 lb)    General:  Nontoxic, alert and appropriate Neck: supple. HEENT:  deferred Lungs:  CTA, no tachypnea and no respiratory distress Cor:  RRR and not tachycardic Abd:  deferred Skin:  no rash or lesions found Neuro:  no focal deficits Psych:  at patient's baseline  Diagnostics: Labs:  No results found for this visit on 04/21/20.  X-Ray: No orders to display   Evaluated and treated the patient under the supervision of ED attending, Dr. Lafe, prior to discharge. Attending agreed with current plan of care.   Damien Holland, PA-C
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
## 2022-08-24 ENCOUNTER — Telehealth: Payer: Self-pay

## 2022-08-24 ENCOUNTER — Ambulatory Visit: Payer: Self-pay

## 2022-08-24 NOTE — Telephone Encounter (Signed)
Pt's appointment rescheduled to 3/28 @ 10:00.  Pt verbalizes understanding.

## 2022-08-25 ENCOUNTER — Ambulatory Visit: Payer: Self-pay

## 2022-10-17 ENCOUNTER — Ambulatory Visit: Payer: Self-pay | Admitting: Family Medicine

## 2022-10-17 DIAGNOSIS — A599 Trichomoniasis, unspecified: Secondary | ICD-10-CM

## 2022-10-17 DIAGNOSIS — Z113 Encounter for screening for infections with a predominantly sexual mode of transmission: Secondary | ICD-10-CM

## 2022-10-17 LAB — HM HIV SCREENING LAB: HM HIV Screening: NEGATIVE

## 2022-10-17 MED ORDER — METRONIDAZOLE 500 MG PO TABS: 500.0000 mg | ORAL_TABLET | Freq: Two times a day (BID) | ORAL | 0 refills | Status: AC

## 2022-10-17 NOTE — Progress Notes (Addendum)
Pt is here for STD screening.  Wet mount results reviewed.  The patient was dispensed Metronidazole 500 mg #14 today. I provided counseling today regarding the medication. We discussed the medication, the side effects and when to call clinic. Patient given the opportunity to ask questions. Questions answered.  Condoms given.  Markan Cazarez M Sevon Rotert, RN  

## 2022-10-17 NOTE — Progress Notes (Signed)
[facility] Department  STI clinic/screening visit [address] [place] [address] [phone]  Subjective:  Sharon Schmitt is a 42 y.o. female being seen today for an STI screening visit. The patient reports they do not have symptoms.  Patient reports that they do not desire a pregnancy in the next year.   They reported they are not interested in discussing contraception today.    Patient's last menstrual period was 09/28/2022 (approximate).  Patient has the following medical conditions:  There are no problems to display for this patient.   Chief Complaint  Patient presents with   SEXUALLY TRANSMITTED DISEASE    Screening    HPI  Patient reports to clinic for STI testing.   Does the patient using douching products? No  Last HIV test per patient/review of record was No results found for: "HMHIVSCREEN" No results found for: "HIV" Patient reports last pap was No results found for: "DIAGPAP" No results found for: "SPECADGYN"  Screening for MPX risk: Does the patient have an unexplained rash? No Is the patient MSM? No Does the patient endorse multiple sex partners or anonymous sex partners? No Did the patient have close or sexual contact with a person diagnosed with MPX? No Has the patient traveled outside the [place] where MPX is endemic? No Is there a high clinical suspicion for MPX-- evidenced by one of the following No  -Unlikely to be chickenpox  -Lymphadenopathy  -Rash that present in same phase of evolution on any given body part See flowsheet for further details and programmatic requirements.   Immunization history:   There is no immunization history on file for this patient.   The following portions of the patient's history were reviewed and updated as appropriate: allergies, current medications, past medical history, past social history, past surgical history and problem list.  Objective:  There were no vitals filed for this  visit.  Physical Exam Vitals and nursing note reviewed.  Constitutional:      Appearance: Normal appearance.  HENT:     Head: Normocephalic and atraumatic.     Mouth/Throat:     Mouth: Mucous membranes are moist.     Pharynx: Oropharynx is clear. No oropharyngeal exudate or posterior oropharyngeal erythema.  Pulmonary:     Effort: Pulmonary effort is normal.  Abdominal:     General: Abdomen is flat.     Palpations: There is no mass.     Tenderness: There is no abdominal tenderness. There is no rebound.  Genitourinary:    General: Normal vulva.     Exam position: Lithotomy position.     Pubic Area: No rash or pubic lice.      Labia:        Right: No rash or lesion.        Left: No rash or lesion.      Vagina: Normal. No vaginal discharge, erythema, bleeding or lesions.     Cervix: No cervical motion tenderness, discharge, friability, lesion or erythema.     Uterus: Normal.      Adnexa: Right adnexa normal and left adnexa normal.     Rectum: Normal.       Comments: pH = 4  Blue circle where pt had "hair bump" dried  Lymphadenopathy:     Head:     Right side of head: No preauricular or posterior auricular adenopathy.     Left side of head: No preauricular or posterior auricular adenopathy.     Cervical: No cervical  adenopathy.     Upper Body:     Right upper body: No supraclavicular, axillary or epitrochlear adenopathy.     Left upper body: No supraclavicular, axillary or epitrochlear adenopathy.     Lower Body: No right inguinal adenopathy. No left inguinal adenopathy.  Skin:    General: Skin is warm and dry.     Findings: No rash.  Neurological:     Mental Status: She is alert and oriented to person, place, and time.      Assessment and Plan:  Sharon Schmitt is a 42 y.o. female presenting to the [facility] Department for STI screening  1. Screening for venereal disease  - Chlamydia/Gonorrhea [place] [facility] - HIV [place] [facility] - Syphilis Serology,  [place] [facility] - WET PREP FOR TRICH, YEAST, CLUE   Patient accepted all screenings including  vaginal CT/GC and bloodwork for HIV/RPR, and wet prep. Patient meets criteria for HepB screening? No. Ordered? not applicable Patient meets criteria for HepC screening? No. Ordered? not applicable  Treat wet prep per standing order Discussed time line for [facility] results and that patient will be called with positive results and encouraged patient to call if she had not heard in 2 weeks.  Counseled to return or seek care for continued or worsening symptoms Recommended repeat testing in 3 months with positive results. Recommended condom use with all sex  Patient is currently using  abstinence   to prevent pregnancy.    Return if symptoms worsen or fail to improve, for STI screening.  No future appointments. Total time spent 15 minutes  Lenice Llamas, [place]

## 2022-10-17 NOTE — Addendum Note (Signed)
Addended by: Berdie Ogren on: 10/17/2022 03:24 PM   Modules accepted: Orders

## 2022-10-18 LAB — WET PREP FOR TRICH, YEAST, CLUE
Trichomonas Exam: POSITIVE — AB
Yeast Exam: NEGATIVE

## 2022-11-07 ENCOUNTER — Emergency Department: Payer: No Typology Code available for payment source

## 2022-11-07 ENCOUNTER — Other Ambulatory Visit: Payer: Self-pay

## 2022-11-07 ENCOUNTER — Emergency Department
Admission: EM | Admit: 2022-11-07 | Discharge: 2022-11-07 | Disposition: A | Discharge status: Home / Self Care | Payer: No Typology Code available for payment source | Attending: Emergency Medicine | Admitting: Emergency Medicine

## 2022-11-07 DIAGNOSIS — S0990XA Unspecified injury of head, initial encounter: Secondary | ICD-10-CM

## 2022-11-07 DIAGNOSIS — S161XXA Strain of muscle, fascia and tendon at neck level, initial encounter: Secondary | ICD-10-CM

## 2022-11-07 NOTE — Discharge Instructions (Signed)
Your CT scan today did not show any concerning findings.  Please take Tylenol or ibuprofen if needed for pain.  Return to the emergency department if symptoms change or worsen and you are unable to see primary care.
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
## 2022-12-29 ENCOUNTER — Ambulatory Visit: Payer: Self-pay | Admitting: Advanced Practice Midwife

## 2022-12-29 ENCOUNTER — Encounter: Payer: Self-pay | Admitting: Advanced Practice Midwife

## 2022-12-29 DIAGNOSIS — T7411XS Adult physical abuse, confirmed, sequela: Secondary | ICD-10-CM

## 2022-12-29 DIAGNOSIS — F17200 Nicotine dependence, unspecified, uncomplicated: Secondary | ICD-10-CM | POA: Insufficient documentation

## 2022-12-29 DIAGNOSIS — A599 Trichomoniasis, unspecified: Secondary | ICD-10-CM

## 2022-12-29 DIAGNOSIS — Z113 Encounter for screening for infections with a predominantly sexual mode of transmission: Secondary | ICD-10-CM

## 2022-12-29 DIAGNOSIS — T7411XA Adult physical abuse, confirmed, initial encounter: Secondary | ICD-10-CM | POA: Insufficient documentation

## 2022-12-29 LAB — WET PREP FOR TRICH, YEAST, CLUE
Trichomonas Exam: NEGATIVE
Yeast Exam: NEGATIVE

## 2022-12-29 LAB — HM HIV SCREENING LAB: HM HIV Screening: NEGATIVE

## 2022-12-29 LAB — HM HEPATITIS C SCREENING LAB: HM Hepatitis Screen: NEGATIVE

## 2022-12-29 NOTE — Progress Notes (Signed)
Pt appointment for STI screening. Seen by Lesia Sago. Wet prep results all negative and reviewed with pt. Condoms given.
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
## 2024-01-29 ENCOUNTER — Emergency Department
Admission: EM | Admit: 2024-01-29 | Discharge: 2024-01-30 | Disposition: A | Discharge status: Other Acute Inpatient Hospital | Payer: Self-pay

## 2024-01-29 ENCOUNTER — Other Ambulatory Visit: Payer: Self-pay

## 2024-01-29 DIAGNOSIS — R4589 Other symptoms and signs involving emotional state: Secondary | ICD-10-CM

## 2024-01-29 DIAGNOSIS — F32A Depression, unspecified: Secondary | ICD-10-CM | POA: Insufficient documentation

## 2024-01-29 DIAGNOSIS — R462 Strange and inexplicable behavior: Secondary | ICD-10-CM

## 2024-01-29 DIAGNOSIS — F1990 Other psychoactive substance use, unspecified, uncomplicated: Secondary | ICD-10-CM

## 2024-01-29 DIAGNOSIS — R45851 Suicidal ideations: Secondary | ICD-10-CM | POA: Insufficient documentation

## 2024-01-29 DIAGNOSIS — F1910 Other psychoactive substance abuse, uncomplicated: Secondary | ICD-10-CM | POA: Insufficient documentation

## 2024-01-29 LAB — COMPREHENSIVE METABOLIC PANEL WITH GFR
ALT: 19 U/L (ref 0–44)
AST: 26 U/L (ref 15–41)
Albumin: 3.6 g/dL (ref 3.5–5.0)
Alkaline Phosphatase: 49 U/L (ref 38–126)
Anion gap: 9 (ref 5–15)
BUN: 13 mg/dL (ref 6–20)
CO2: 26 mmol/L (ref 22–32)
Calcium: 9.3 mg/dL (ref 8.9–10.3)
Chloride: 104 mmol/L (ref 98–111)
Creatinine, Ser: 0.74 mg/dL (ref 0.44–1.00)
GFR, Estimated: >60 mL/min (ref >60)
Glucose, Bld: 95 mg/dL (ref 70–99)
Potassium: 3.9 mmol/L (ref 3.5–5.1)
Sodium: 139 mmol/L (ref 135–145)
Total Bilirubin: 0.6 mg/dL (ref 0.0–1.2)
Total Protein: 7.1 g/dL (ref 6.5–8.1)

## 2024-01-29 LAB — CBC
HCT: 43.1 % (ref 36.0–46.0)
Hemoglobin: 14.6 g/dL (ref 12.0–15.0)
MCH: 30.7 pg (ref 26.0–34.0)
MCHC: 33.9 g/dL (ref 30.0–36.0)
MCV: 90.7 fL (ref 80.0–100.0)
Platelets: 313 K/uL (ref 150–400)
RBC: 4.75 MIL/uL (ref 3.87–5.11)
RDW: 14.5 % (ref 11.5–15.5)
WBC: 6.9 K/uL (ref 4.0–10.5)
nRBC: 0 % (ref 0.0–0.2)

## 2024-01-29 LAB — URINE DRUG SCREEN, QUALITATIVE (ARMC ONLY)
Amphetamines, Ur Screen: NOT DETECTED
Barbiturates, Ur Screen: NOT DETECTED
Benzodiazepine, Ur Scrn: NOT DETECTED
Cannabinoid 50 Ng, Ur ~~LOC~~: POSITIVE — AB
Cocaine Metabolite,Ur ~~LOC~~: POSITIVE — AB
MDMA (Ecstasy)Ur Screen: NOT DETECTED
Methadone Scn, Ur: NOT DETECTED
Opiate, Ur Screen: NOT DETECTED
Phencyclidine (PCP) Ur S: NOT DETECTED
Tricyclic, Ur Screen: POSITIVE — AB

## 2024-01-29 LAB — PREGNANCY, URINE: Preg Test, Ur: NEGATIVE

## 2024-01-29 LAB — ETHANOL: Alcohol, Ethyl (B): <15 mg/dL (ref <15)

## 2024-01-29 NOTE — ED Notes (Signed)
Labs were drawn and sent to lab.

## 2024-01-29 NOTE — ED Notes (Signed)
 Patient's car keys given to mother with patient's permission.

## 2024-01-29 NOTE — BH Assessment (Signed)
 Patient has been accepted to [facility] on tomm 01/30/24. Patient assigned to room 306, bed# 2. Accepting physician is Dr. Prentis.  Call report to [phone].  Representative was [employer].   ER Staff is aware of it:  [name], ER Secretary  Dr. Dicky, ER MD  Delon, Patient's Nurse     Writer unable to leave message on Patient's Family/Support System [facility] [phone]-mom) voicemail because it is full.

## 2024-01-29 NOTE — ED Triage Notes (Addendum)
 Pt to ED via [facility] under IVC. Papers state pt had recent miscarriage and posted on facebook people wouldn't hear from her anymore. Pt's mother took out IVC papers in fear pt would harm herself. On scene pt was trying to delete post and said it was a misunderstanding.   Pt denies etoh or drug use.   Pt refusing blood draw in triage. Rn stressed the importance of getting blood work as to not delay treatment.

## 2024-01-29 NOTE — BH Assessment (Signed)
 Writer received call from patient's mom, Sharon Schmitt. Mom reports she is scared for patient's safety which is why she took out IVC paperwork. [name] states patient made a similar post on FB in July stating that she needed help. Mom suspects patient is using drugs but unable to verify. She reports patient lost her job in April, her boyfriend broke up with her and just a few days ago, patient mentioned to mom that she had a miscarriage in which she lost a lot of blood. Mom says patient is not her lively, confident self. She is all over the place and I don't know how to help her.

## 2024-01-29 NOTE — ED Notes (Signed)
 Meal given

## 2024-01-29 NOTE — ED Notes (Signed)
 Meal provided

## 2024-01-29 NOTE — ED Notes (Addendum)
 Pt dressed out by this RN and EDT: Pt has zebra pans 1 white shirt  1 black leggings 1 purse 1 black bra 1 white tank  Pt refusing to take hair tie out

## 2024-01-29 NOTE — BH Assessment (Signed)
 Writer called patient's mom and informed her of patient's disposition [phone](Home#). Writer provided address and phone number to accepting facility. Mom is coming to ED to pick up patient's car key so she is able to move the vehicle.

## 2024-01-29 NOTE — ED Notes (Signed)
 Snacks provided to pt

## 2024-01-29 NOTE — ED Notes (Signed)
IVC  MOVED TO  [facility]  PENDING  CONSULT 

## 2024-01-29 NOTE — BH Assessment (Signed)
 Writer left message on voicemail for patient's mom, Jelena Malicoat [phone] to return call.

## 2024-01-29 NOTE — ED Provider Notes (Signed)
 EKG inter by me at 1633 heart rate 65 QRS 60 QTc 400.  Normal sinus rhythm no evidence of acute ischemia.  Q wave anteroseptal.    Dicky Anes, MD 01/29/24 1836

## 2024-01-29 NOTE — ED Provider Notes (Signed)
 [facility] Provider Note    Event Date/Time   First MD Initiated Contact with Patient 01/29/24 1153     (approximate)   History   No chief complaint on file.   HPI  Sharon Schmitt is a 43 y.o. female with no significant past medical history who presents with reported suicidal ideation.  Patient reportedly had a miscarriage 3 to 4 months ago.  Today on Facebook she posted a message that stated that  nobody was going to hear from her anymore.  Her patient's mother became worried about suicide and took out IVC papers assuming that the patient would harm herself.  Patient was found by EMS attempting to delete the post.  The patient denies any SI HI or AVH just currently and states that it was all a misunderstanding.  She denies any physical complaints including no chest pain or shortness of breath or any vaginal bleeding      Physical Exam   Triage Vital Signs: ED Triage Vitals  Encounter Vitals Group     BP 01/29/24 1029 (!) 129/92     Girls Systolic BP Percentile --      Girls Diastolic BP Percentile --      Boys Systolic BP Percentile --      Boys Diastolic BP Percentile --      Pulse Rate 01/29/24 1029 81     Resp 01/29/24 1029 18     Temp 01/29/24 1029 98 F (36.7 C)     Temp Source 01/29/24 1029 Oral     SpO2 01/29/24 1029 99 %     Weight --      Height --      Head Circumference --      Peak Flow --      Pain Score 01/29/24 1045 0     Pain Loc --      Pain Education --      Exclude from Growth Chart --     Most recent vital signs: Vitals:   01/29/24 1029  BP: (!) 129/92  Pulse: 81  Resp: 18  Temp: 98 F (36.7 C)  SpO2: 99%    Nursing Triage Note reviewed. Vital signs reviewed and patients oxygen saturation is normoxic General: Patient is well nourished, well developed, awake and alert, resting comfortably in no acute distress Head: Normocephalic and atraumatic Eyes: Normal inspection, extraocular muscles intact, no  conjunctival pallor Ear, nose, throat: Normal external exam Neck: Normal range of motion Respiratory: Patient is in no respiratory distress, lungs CTAB Cardiovascular: Patient is not tachycardic, RRR without murmur appreciated GI: Abd SNT with no guarding or rebound  Back: Normal inspection of the back with good strength and range of motion throughout all ext Extremities: pulses intact with good cap refills, no LE pitting edema or calf tenderness Neuro: The patient is alert and oriented to person, place, and time, appropriately conversive, with 5/5 bilat UE/LE strength, no gross motor or sensory defects noted. Coordination appears to be adequate. Skin: Warm, dry, and intact Psych: depressed mood and affect, no SI or HI  ED Results / Procedures / Treatments   Labs (all labs ordered are listed, but only abnormal results are displayed) Labs Reviewed  URINE DRUG SCREEN, QUALITATIVE ([facility] ONLY) - Abnormal; Notable for the following components:      Result Value   Tricyclic, Ur Screen POSITIVE (*)    Cocaine Metabolite,Ur [place] POSITIVE (*)    Cannabinoid 50 Ng, Ur [place] POSITIVE (*)  All other components within normal limits  COMPREHENSIVE METABOLIC PANEL WITH GFR  ETHANOL  CBC  PREGNANCY, URINE  POC URINE PREG, ED     EKG None  RADIOLOGY None    PROCEDURES:  Critical Care performed: No  Procedures   MEDICATIONS ORDERED IN ED: Medications - No data to display   IMPRESSION / MDM / ASSESSMENT AND PLAN / ED COURSE                                Differential diagnosis includes, but is not limited to, organic psychiatric disorder, substance use, anemia, electrolyte derangement, pregnancy   ED course: Patient presents on an IVC and given the concerning history I have opted to continue this for psychiatric assessment.  She has no evidence of an overt toxidrome but her UDS did come back positive for multiple substances.  She is not acutely anemic or without any electrolyte  derangement..  A urine pregnancy test is pending.  Patient is medically cleared for psychiatric disposition and will be signed out to oncoming physician   Clinical Course as of 01/29/24 1350  Mon Jan 29, 2024  1344 Urine Drug Screen, Qualitative(!) Positive for tricyclic's cocaine and cannabinoids [HD]  1344 Comprehensive metabolic panel No acute abnormality [HD]  1344 HCT: 43.1 No anemia [HD]    Clinical Course User Index [HD] Nicholaus Rolland BRAVO, MD   ---  Risk: 5 This patient has a high risk of morbidity due to further diagnostic testing or treatment. Rationale: This patient's evaluation and management involve a high risk of morbidity due to the potential severity of presenting symptoms, need for diagnostic testing, and/or initiation of treatment that may require close monitoring. The differential includes conditions with potential for significant deterioration or requiring escalation of care. Treatment decisions in the ED, including medication administration, procedural interventions, or disposition planning, reflect this level of risk. Additional Support: -- Drug therapy requiring intensive monitoring for toxicity [ ]  -- Decision regarding elective major surgery with idenitified patient or procedure risk factors [ ]  -- Decision regarding hospitalization or escalation of hospital-level care [ ]  -- Decision not to resuscitate or to de-escalate care because of poor prognosis [ ]  -- Parental controlled substances [ ]   COPA: 5 The patient has a severe exacerbation, progression, or side effect of treatment of the following illness/illnesses: []  OR  The patient has the following acute or chronic illness/injury that poses a possible threat to life or bodily function: [X] : The patient has a potentially serious acute condition or an acute exacerbation of a chronic illness requiring urgent evaluation and management in the Emergency Department. The clinical presentation necessitates immediate  consideration of life-threatening or function-threatening diagnoses, even if they are ultimately ruled out.  Data(2/3 categories following were performed): 5 I reviewed or ordered at least three unique tests, external notes, and/or the history required an independent historian as one of the three requirements as following: cbc, bmp, uds AND  I independently interpreted the following test: []  OR  I discussed the management of the patient with the following external physician or qualified healthcare provider: psych physician  Suggested E/M Coding Level: 5, [phone], This has been selected based on the 2022/02/19 CPT guidelines for E/M codes in the Emergency Department based on 2/3 of the CoPA, Data, and Risk.   FINAL CLINICAL IMPRESSION(S) / ED DIAGNOSES   Final diagnoses:  Bizarre behavior  Depressed mood  Polysubstance use disorder  Rx / DC Orders   ED Discharge Orders     None        Note:  This document was prepared using Dragon voice recognition software and may include unintentional dictation errors.   Nicholaus Rolland BRAVO, MD 01/29/24 1350

## 2024-01-29 NOTE — ED Notes (Signed)
Patient in assigned room.

## 2024-01-29 NOTE — ED Notes (Signed)
 Patient's mother Justeen Hehr called to report someone from the hospital attempted to call her; explained she answered the phone but couldn't hear anyone. Ms Dicostanzo provided house number to try: [phone]. RN sent message to counselor who charted attempt at contacting family member; she reported she would reach out at number given.

## 2024-01-29 NOTE — Consult Note (Signed)
 [facility] Psychiatric Consult Initial  Patient Name: .Sharon Schmitt  MRN: [id number]  DOB: 10/16/1980  Consult Order details:  Orders (From admission, onward)     Start     Ordered   01/29/24 1158  CONSULT TO CALL ACT TEAM       Ordering Provider: Nicholaus Rolland BRAVO, MD  Provider:  (Not yet assigned)  Question:  Reason for Consult?  Answer:  Psych consult   01/29/24 1158   01/29/24 1158  IP CONSULT TO PSYCHIATRY       Ordering Provider: Nicholaus Rolland BRAVO, MD  Provider:  (Not yet assigned)  Question:  Reason for consult:  Answer:  Medication management   01/29/24 1158             Mode of Visit: In person    Psychiatry Consult Evaluation  Service Date: January 29, 2024 LOS:  LOS: 0 days  Chief Complaint Suicidal ideation   Primary Psychiatric Diagnoses  Suicidal ideation    Assessment  BHAVYA Schmitt is a 43 y.o. female admitted: Presented to the ED Patient with no significant past medical history who presents with reported suicidal ideation.  Patient reportedly had a miscarriage 3 to 4 months ago.  Today on Facebook she posted a message that stated that  nobody was going to hear from her anymore.  Her patient's mother became worried about suicide and took out IVC papers assuming that the patient would harm herself.  Patient was found by EMS attempting to delete the post.  The patient denies any SI HI or AVH just currently and states that it was all a misunderstanding.   Psychiatry consulted for safety evaluation.   On assessment, patient denies depression, anxiety, PTSD, mania.  She denies SI/HI/plan and denies hallucinations. Patient is minimizing the symptoms and wants to leave. Collaterals form Mom reports concern about patient's safety due to ongoing social stressors including recent miscarriage, relationship breakage, relapse on poly substances. Maintain IVC and recommend inpatient psychiatric admission.  Diagnoses:  Active Hospital problems: Active  Problems:   * No active hospital problems. *    Plan   ## Psychiatric Medication Recommendations:  Recommend Inpatient psychiatric admission on IVC   ## Medical Decision Making Capacity: Not specifically addressed in this encounter  ## Further Work-up:    -- Pertinent labwork reviewed earlier this admission includes: CMP, CBC, glucose, urine pregnancy test, blood toxicology screen, urine toxicology screen.  Urine toxicology screen positive for cocaine, cannabinoids, tricyclic.    ## Disposition:-- We recommend inpatient psychiatric hospitalization after medical hospitalization. Patient has been involuntarily committed on 01/29/24.   ## Behavioral / Environmental: -Utilize compassion and acknowledge the patient's experiences while setting clear and realistic expectations for care.    ## Safety and Observation Level:  - Based on my clinical evaluation, I estimate the patient to be at moderate risk of self harm in the current setting. - At this time, we recommend  routine. This decision is based on my review of the chart including patient's history and current presentation, interview of the patient, mental status examination, and consideration of suicide risk including evaluating suicidal ideation, plan, intent, suicidal or self-harm behaviors, risk factors, and protective factors. This judgment is based on our ability to directly address suicide risk, implement suicide prevention strategies, and develop a safety plan while the patient is in the clinical setting. Please contact our team if there is a concern that risk level has changed.  CSSR Risk Category:C-SSRS RISK CATEGORY: No Risk  Suicide Risk Assessment: Patient has following modifiable risk factors for suicide: triggering events and recent loss (death, isolation, vocation), which we are addressing by inpatient psychiatric admission. Patient has following non-modifiable or demographic risk factors for suicide: none  identified Patient has the following protective factors against suicide: no history of suicide attempts and no history of NSSIB  Thank you for this consult request. Recommendations have been communicated to the primary team.  We will sign off at this time.   Crystal LITTIE Lukes, PA-C       History of Present Illness  Relevant Aspects of Hospital ED   Patient Report:  Patient reports making a post on Facebook stating that this will be the last time anyone will hear from her, and states this was taken out of context by her mother as an indication of suicidal ideation. Patient states that she only meant that she will no longer be posting on Facebook and denies the post being related to suicidal ideation. She states she is unsure why her mother would think that she is depressed or suicidal.  Patient denies current or past history of depression, anxiety, PTSD, mania.  She denies SI/HI/plan. She denies hallucinations.  She endorses stress and difficult times in her life, stating she has survived some things but declines to provide more detail.  She denies a history of physical, sexual, verbal, or emotional abuse. She lives with her mother.   Psych ROS:  Depression: Denies Anxiety: Denies Mania (lifetime and current): Denies Psychosis: (lifetime and current): Denies  Collateral information:  Contacted MOM who expressed her concerns about patient  and statedLisa [place]. Mom reports she is scared for patient's safety which is why she took out IVC paperwork. [name] states patient made a similar post on FB in July stating that she needed help. Mom suspects patient is using drugs but unable to verify. She reports patient lost her job in April, her boyfriend broke up with her and just a few days ago, patient mentioned to mom that she had a miscarriage in which she lost a lot of blood. Mom says patient is not her lively, confident self. She is all over the place and I don't know how to help her.       Psychiatric and Social History  Psychiatric History:  Information collected from the patient.  Prev Dx/Sx: Denies Current Psych Provider: Denies Home Meds (current): Denies Previous Med Trials: Denies Therapy: Denies  Prior Psych Hospitalization: Denies Prior Self Harm: Denies Prior Violence: Denies  Family Psych History: Denies Family Hx suicide: Denies  Social History:   Educational Hx: some college  Occupational Hx: Currently unemployed for the last 1.5 months. 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 Legal Hx: Arrested for possession of marijuana, charges pending Living Situation: Lives with mother Access to weapons/lethal means: Denies  Substance History Alcohol: Denies Tobacco: Smokes 2 cigarettes per week  Illicit drugs: Denies. Urine drug screen performed 01/29/24 positive for cocaine, cannabinoids, tricyclic  Prescription drug abuse: Denies Rehab hx: Denies  Exam Findings  Physical Exam: Reviewed and agree with the physical exam findings conducted by the medical provider.  Vital Signs:  Temp:  [98 F (36.7 C)] 98 F (36.7 C) (09/01 1029) Pulse Rate:  [81] 81 (09/01 1029) Resp:  [18] 18 (09/01 1029) BP: (129)/(92) 129/92 (09/01 1029) SpO2:  [99 %] 99 % (09/01 1029) Blood pressure (!) 129/92, pulse 81, temperature 98 F (36.7 C), temperature source Oral, resp. rate 18, SpO2  99%. There is no height or weight on file to calculate BMI.    Mental Status Exam: General Appearance: Casual  Orientation:  Full (Time, Place, and Person)  Memory:  Recent;   Good Remote;   Good  Concentration:  Concentration: Good  Recall:  Good  Attention  Good  Eye Contact:  Good  Speech:  Clear and Coherent  Language:  Good  Volume:  Normal  Mood: euthymic   Affect:  Appropriate  Thought Process:  Coherent  Thought Content:  Logical  Suicidal Thoughts:  No  Homicidal Thoughts:  No  Judgement:  Fair  Insight:  Fair  Psychomotor  Activity:  Normal  Akathisia:  No  Fund of Knowledge:  Fair      Assets:  Engineer, maintenance Social Support  Cognition:  WNL  ADL's:  Intact  AIMS (if indicated):        Other History   These have been pulled in through the EMR, reviewed, and updated if appropriate.  Family History:  The patient's family history is not on file.  Medical History: History reviewed. No pertinent past medical history.  Surgical History: History reviewed. No pertinent surgical history.   Medications:  No current facility-administered medications for this encounter.  Current Outpatient Medications:    cetirizine (ZYRTEC) 10 MG tablet, Take 10 mg by mouth as needed for allergies., Disp: , Rfl:    ibuprofen  (ADVIL ) 600 MG tablet, Take 1 tablet (600 mg total) by mouth every 6 (six) hours as needed., Disp: 30 tablet, Rfl: 0   chlorhexidine  (PERIDEX ) 0.12 % solution, Use as directed 15 mLs in the mouth or throat 2 (two) times daily. (Patient not taking: Reported on 10/17/2022), Disp: 120 mL, Rfl: 0   clindamycin  (CLEOCIN ) 150 MG capsule, Take 3 capsules (450 mg total) by mouth 4 (four) times daily. (Patient not taking: Reported on 10/17/2022), Disp: 84 capsule, Rfl: 0   cyclobenzaprine  (FLEXERIL ) 5 MG tablet, Take 1-2 tablets 3 times daily as needed (Patient not taking: Reported on 10/17/2022), Disp: 20 tablet, Rfl: 0   HYDROcodone -acetaminophen  (NORCO) 5-325 MG tablet, Take 1 tablet by mouth every 6 (six) hours as needed for moderate pain. (Patient not taking: Reported on 10/17/2022), Disp: 15 tablet, Rfl: 0  Allergies: No Known Allergies  Crystal LITTIE Lukes, PA-C

## 2024-01-29 NOTE — BH Assessment (Addendum)
 Comprehensive Clinical Assessment (CCA) Screening, Triage and Referral Note  01/29/2024 Sharon Schmitt [id number]  Sharon Schmitt, 43 year old female who presents to [facility] ED involuntarily for treatment. Per triage note, Pt to ED via BPD under IVC. Papers state pt had recent miscarriage and posted on Facebook people wouldn't hear from her anymore. Pt's mother took out IVC papers in fear pt would harm herself. On scene pt was trying to delete the post and said it was a misunderstanding. Pt denies etoh or drug use.   During TTS assessment pt presents alert and oriented x 4, restless but cooperative, and mood-congruent with affect. The pt does not appear to be responding to internal or external stimuli. Neither is the pt presenting with any delusional thinking. Pt verified the information provided to triage RN.   Pt identifies her main complaint to be that "It's a misunderstanding." Patient reports she made a post that was taken "out of context" and her mom called the police. Patient states she posted "this is my last post...meaning she wasn't going to post on the Facebook social media platform again, not that she wanted to end her life. "I have a lot to live for." Patient reports recently she was betrayed by a family friend yet says she views it as a way of God removing people from her life. Pt denies using any illicit substances and drinks occasionally. Patient reports no prior attempts, no firearms in the home, no current psych meds and no self-injurious behaviors. Patient says she lives at home with her mom. Patient is not currently working but seeking employment within the healthcare industry. Pt reports no prior INPT hx. Pt denies current SI/HI/AH/VH. Pt contracts for safety. Pt provided her Sharon Schmitt, Sharon Schmitt [phone] as a collateral contact.   ** Patient UDS positive for cocaine and cannabinoid **   Per Dr. Jadapalle, pt is recommended for inpatient psychiatric admission.  Chief Complaint: No  chief complaint on file.  Visit Diagnosis: Depressed mood  Patient Reported Information How did you hear about us ? Family/Friend  What Is the Reason for Your Visit/Call Today? Patient reports she was brough to the ED due to recent post on FB that was a misunderstanding.  How Long Has This Been Causing You Problems? <Week  What Do You Feel Would Help You the Most Today? -- (Assessment only)   Have You Recently Had Any Thoughts About Hurting Yourself? No  Are You Planning to Commit Suicide/Harm Yourself At This time? No   Have you Recently Had Thoughts About Hurting Someone [place]? No  Are You Planning to Harm Someone at This Time? No  Explanation: No data recorded  Have You Used Any Alcohol or Drugs in the Past 24 Hours? No  How Long Ago Did You Use Drugs or Alcohol? No data recorded What Did You Use and How Much? No data recorded  Do You Currently Have a Therapist/Psychiatrist? No  Name of Therapist/Psychiatrist: No data recorded  Have You Been Recently Discharged From Any Office Practice or Programs? No  Explanation of Discharge From Practice/Program: No data recorded   CCA Screening Triage Referral Assessment Type of Contact: Face-to-Face  Telemedicine Service Delivery:   Is this Initial or Reassessment?   Date Telepsych consult ordered in [facility]:    Time Telepsych consult ordered in [facility]:    Location of Assessment: [facility] ED  Provider Location: [facility] ED    Collateral Involvement: Sharon Schmitt [phone]   Does Patient Have a Court Appointed Legal Guardian?  No data recorded Name and Contact of Legal Guardian: No data recorded If Minor and Not Living with Parent(s), Who has Custody? No data recorded Is CPS involved or ever been involved? No data recorded Is APS involved or ever been involved? No data recorded  Patient Determined To Be At Risk for Harm To Self or Others Based on Review of Patient Reported Information or Presenting Complaint? No  Method: No  Plan  Availability of Means: No access or NA  Intent: Vague intent or NA  Notification Required: No need or identified person  Additional Information for Danger to Others Potential: No data recorded Additional Comments for Danger to Others Potential: No data recorded Are There Guns or Other Weapons in Your Home? No  Types of Guns/Weapons: No data recorded Are These Weapons Safely Secured?                            No data recorded Who Could Verify You Are Able To Have These Secured: No data recorded Do You Have any Outstanding Charges, Pending Court Dates, Parole/Probation? Yes, patient has court on 02/08/24 for possession of marijuana.  Contacted To Inform of Risk of Harm To Self or Others: No data recorded  Does Patient Present under Involuntary Commitment? Yes    [place] of Residence: [place]   Patient Currently Receiving the Following Services: Not Receiving Services   Determination of Need: Emergent (2 hours)   Options For Referral: ED Visit   Disposition Recommendation per psychiatric provider: Per Dr. Jadapalle, pt is recommended for overnight observation to be reassessed in the morning pending collateral input.    Alexxander Kurt JONELLE Dolly, Counselor, LCAS-A

## 2024-01-29 NOTE — ED Notes (Signed)
 IVC FROM  [facility]  PENDING  CONSULT

## 2024-01-29 NOTE — ED Provider Notes (Signed)
 Psychiatrist advised patient to referred for inpatient psychiatric hospitalization.     Dicky Anes, MD 01/29/24 [phone]

## 2024-01-29 NOTE — ED Notes (Signed)
 [facility]

## 2024-01-29 NOTE — ED Notes (Signed)
 EKG performed by NT, given to MD Quale for review. No new orders at this time

## 2024-01-30 ENCOUNTER — Other Ambulatory Visit: Payer: Self-pay

## 2024-01-30 ENCOUNTER — Inpatient Hospital Stay (HOSPITAL_COMMUNITY)
Admission: AD | Admit: 2024-01-30 | Discharge: 2024-02-02 | DRG: 885 | Disposition: A | Discharge status: Home / Self Care | Source: Intra-hospital

## 2024-01-30 ENCOUNTER — Encounter (HOSPITAL_COMMUNITY): Payer: Self-pay | Admitting: Psychiatry

## 2024-01-30 DIAGNOSIS — F17210 Nicotine dependence, cigarettes, uncomplicated: Secondary | ICD-10-CM | POA: Diagnosis present

## 2024-01-30 DIAGNOSIS — G4700 Insomnia, unspecified: Secondary | ICD-10-CM | POA: Diagnosis present

## 2024-01-30 DIAGNOSIS — F332 Major depressive disorder, recurrent severe without psychotic features: Principal | ICD-10-CM | POA: Diagnosis present

## 2024-01-30 MED ORDER — ALUM & MAG HYDROXIDE-SIMETH 200-200-20 MG/5ML PO SUSP: 30.0000 mL | ORAL | Status: DC | PRN

## 2024-01-30 MED ORDER — LORAZEPAM 2 MG/ML IJ SOLN
2.0000 mg | Freq: Three times a day (TID) | INTRAMUSCULAR | Status: DC | PRN
Start: 2024-01-30 — End: 2024-02-02

## 2024-01-30 MED ORDER — HALOPERIDOL LACTATE 5 MG/ML IJ SOLN
10.0000 mg | Freq: Three times a day (TID) | INTRAMUSCULAR | Status: DC | PRN
Start: 2024-01-30 — End: 2024-02-02

## 2024-01-30 MED ORDER — ACETAMINOPHEN 325 MG PO TABS: 650.0000 mg | ORAL_TABLET | Freq: Four times a day (QID) | ORAL | Status: DC | PRN

## 2024-01-30 MED ORDER — MAGNESIUM HYDROXIDE 400 MG/5ML PO SUSP: 30.0000 mL | Freq: Every day | ORAL | Status: DC | PRN

## 2024-01-30 MED ORDER — DIPHENHYDRAMINE HCL 25 MG PO CAPS
50.0000 mg | ORAL_CAPSULE | Freq: Three times a day (TID) | ORAL | Status: DC | PRN
  Administered 2024-01-31 – 2024-02-01 (×2): 50 mg via ORAL
  Filled 2024-01-30 (×2): qty 2

## 2024-01-30 MED ORDER — HYDROXYZINE HCL 25 MG PO TABS
25.0000 mg | ORAL_TABLET | Freq: Four times a day (QID) | ORAL | Status: DC | PRN
  Administered 2024-01-30: 25 mg via ORAL
  Filled 2024-01-30: qty 1

## 2024-01-30 MED ORDER — HALOPERIDOL 5 MG PO TABS: 5.0000 mg | ORAL_TABLET | Freq: Three times a day (TID) | ORAL | Status: DC | PRN

## 2024-01-30 MED ORDER — TRAZODONE HCL 50 MG PO TABS
50.0000 mg | ORAL_TABLET | Freq: Every evening | ORAL | Status: DC | PRN
  Administered 2024-01-30 – 2024-02-01 (×3): 50 mg via ORAL
  Filled 2024-01-30: qty 7
  Filled 2024-01-30 (×3): qty 1

## 2024-01-30 MED ORDER — HALOPERIDOL LACTATE 5 MG/ML IJ SOLN: 5.0000 mg | Freq: Three times a day (TID) | INTRAMUSCULAR | Status: DC | PRN

## 2024-01-30 MED ORDER — DIPHENHYDRAMINE HCL 50 MG/ML IJ SOLN: 50.0000 mg | Freq: Three times a day (TID) | INTRAMUSCULAR | Status: DC | PRN

## 2024-01-30 MED ORDER — LORAZEPAM 2 MG/ML IJ SOLN: 2.0000 mg | Freq: Three times a day (TID) | INTRAMUSCULAR | Status: DC | PRN

## 2024-01-30 NOTE — ED Provider Notes (Signed)
 Emergency Medicine Observation Re-evaluation Note  Sharon Schmitt is a 43 y.o. female, seen on rounds today.  Pt initially presented to the ED for complaints of No chief complaint on file.  Currently, the patient is no acute distress. NO issues per bhu nurse   Physical Exam  Blood pressure (!) 125/93, pulse 70, temperature 98.5 F (36.9 C), temperature source Oral, resp. rate 17, SpO2 100%.  Physical Exam General: No apparent distress Pulm: Normal WOB Psych: resting     ED Course / MDM   I have reviewed the labs performed to date as well as medications administered while in observation.  Recent changes in the last 24 hours include none  Plan   Current plan is to continue to wait for [facility] 01/30/24 Patient is under full IVC at this time.   Ernest Ronal BRAVO, MD 01/30/24 [phone]

## 2024-01-30 NOTE — ED Provider Notes (Signed)
 Emergency Medicine Observation Re-evaluation Note  Sharon Schmitt is a 43 y.o. female, seen on rounds today.  Pt initially presented to the ED for complaints of No chief complaint on file.  Currently, the patient is resting comfortably.  Physical Exam  BP (!) 125/93 (BP Location: Right Arm)   Pulse 70   Temp 98.5 F (36.9 C) (Oral)   Resp 17   SpO2 100%  General: No acute distress Cardiac: Well-perfused extremities Lungs: No respiratory distress Psych: Appropriate mood and affect  ED Course / MDM  EKG:   I have reviewed the labs performed to date as well as medications administered while in observation.  Recent changes in the last 24 hours include none.  Plan  Current plan is for placement.   Travell Desaulniers K, MD 01/30/24 0130

## 2024-01-30 NOTE — ED Notes (Signed)
 Pt breakfast provided at bedside

## 2024-01-30 NOTE — ED Notes (Signed)
 Phone given back to this RN, mother made aware of transfer by pt.

## 2024-01-30 NOTE — ED Notes (Signed)
 Pt ambulated to and from bathroom, no assistance required.

## 2024-01-30 NOTE — ED Notes (Signed)
 Pt asking to call mother, mother called by this RN and phone given to pt

## 2024-01-30 NOTE — ED Notes (Signed)
[place]  called  for  transport  to [facility] 

## 2024-01-30 NOTE — Progress Notes (Signed)
 Pt arrived IVC for SI. Pt denies SI and state she never had SI. States this is a misunderstanding between her mom. Pt denies HI and AVH. She is tearful ans states it is because she has never been to a place like this and she doesn't belong here. Pt state she recently had a miscarriage. Skin was assessed and WNL. PT searched and no contraband found, POC and unit policies explained and understanding verbalized. Consents obtained. Pt had no additional questions or concerns

## 2024-01-30 NOTE — ED Notes (Signed)
 Called [facility] to give report, was told RN would call back when ready.

## 2024-01-30 NOTE — ED Notes (Addendum)
 Pt asking to brush teeth, given toothpaste and brush.

## 2024-01-30 NOTE — [facility] Group Notes (Signed)
 [facility] Group Notes:  (Nursing/MHT/Case Management/Adjunct)  Date:  01/30/2024  Time:  9:43 PM  Type of Therapy:  Wrap-up group  Participation Level:  Active  Participation Quality:  Appropriate  Affect:  Appropriate  Cognitive:  Appropriate  Insight:  Appropriate  Engagement in Group:  Engaged  Modes of Intervention:  Education  Summary of Progress/Problems:Goal to interact. Rated days 10/10.  Grayce LITTIE Essex 01/30/2024, 9:43 PM

## 2024-01-30 NOTE — ED Notes (Signed)
 IVC/ [facility] 01/30/24

## 2024-01-31 ENCOUNTER — Encounter (HOSPITAL_COMMUNITY): Payer: Self-pay

## 2024-01-31 MED ORDER — DIPHENHYDRAMINE HCL 25 MG PO CAPS
25.0000 mg | ORAL_CAPSULE | Freq: Three times a day (TID) | ORAL | Status: DC | PRN
  Administered 2024-01-31 – 2024-02-01 (×2): 25 mg via ORAL
  Filled 2024-01-31 (×2): qty 1

## 2024-01-31 MED ORDER — FLUTICASONE PROPIONATE 50 MCG/ACT NA SUSP
1.0000 | Freq: Every day | NASAL | Status: DC
  Filled 2024-01-31 (×2): qty 16

## 2024-01-31 NOTE — H&P (Signed)
 Psychiatric Admission Assessment Adult  Patient Identification: Sharon Schmitt  MRN:  [id number]  Date of Evaluation:  01/31/2024  Chief Complaint:  MDD (major depressive disorder), recurrent severe, without psychosis (HCC) [F33.2]  Principal Diagnosis: MDD (major depressive disorder), recurrent severe, without psychosis (HCC)  Diagnosis:  Principal Problem:   MDD (major depressive disorder), recurrent severe, without psychosis (HCC)  History of Present Illness: This is the first psychiatric admission in this [facility] for this 43 yesr olf AA female with probable hx of mental illness & substance use disorder. Admitted to the [facility] from the [facility] with complaint of suicidal ideations after she posted a message on social media saying no body will hear from her anymore. Apparently, this patient suffered a miscarriage 3-4 months ago. Patient's mother involuntarily committed patient to the hospital for psychiatric evaluation because of patient's social media post. During this evaluation, Patient reports,   My mother had taken me to the [facility] in [place], [place] two days ago to be checked out. And while I was at the [facility], the decision was made by the [facility] staff to send me to the [facility]. Because my mama told them that she read my Facebook post & she got concerned about my post. I know she took everything out of contact. I did post on Facebook that I was no long going to be posting stuff on social media. My mother got scared &  thoughts that I was going to hurt myself.  I'm not feeling suicidal, homicidal of depressed. I have never threatened to hurt myself in the past neither have attempted to hurt myself or anybody else. I'm not depressed. I'm not have any symptoms of depression. I had in the past used a little weed & some cocaine, but not recently. I'm not on any medicines at home. I live in [place]. I'm currently in between jobs. I'm not on any medicines for other medical conditions. I do not want to be on any  medicines expect some sleep medicine to regulate my sleep. I do not use drugs.   Objective: Sharon Schmitt currently denies any SIHI, AVH, delusional thoughts or paranoia. She does not appear to be responding to any internal stimuli. She denies any substance withdrawal symptoms. She is already asking how long he is suppose to be in this hospital. Does not want to be on any medications except sleep medicine.  Associated Signs/Symptoms:  Depression Symptoms:  Patient currently denies any symptoms of depression or anxiety.  (Hypo) Manic Symptoms:  Impulsivity,  Anxiety Symptoms:  Excessive Worry, about getting discharged.  Psychotic Symptoms:  Patient currently denies any hallucinations, delusional thoughts or paranoia. She does not appear to be responding to any internal stimuli.  PTSD Symptoms: NA  Total Time spent with patient: 1.5 hours  Past Psychiatric History: Patient denies any past psychiatric diagnoses, hospitalizations or treatments.   Is the patient at risk to self? No. She denies. Has the patient been a risk to self in the past 6 months? No. She denies.  Has the patient been a risk to self within the distant past? No. She denies. Is the patient a risk to others? No. She denies. Has the patient been a risk to others in the past 6 months? No. She denies. Has the patient been a risk to others within the distant past? No. She denies.  [place] Scale:  Flowsheet Row [facility] ([facility]) [facility] from 01/29/2024 in [facility] Department at [facility]  ED from 11/07/2022 in [facility] Emergency Department at [facility]  C-SSRS RISK CATEGORY No Risk No Risk No Risk    Prior Inpatient Therapy: No. If yes, describe:NA   Prior Outpatient Therapy: Yes.   If yes, describe: [facility]   Alcohol Screening: 1. How often do you have a drink containing alcohol?: Never 2. How many drinks containing alcohol do you have on a  typical day when you are drinking?: 1 or 2 3. How often do you have six or more drinks on one occasion?: Never AUDIT-C Score: 0 4. How often during the last year have you found that you were not able to stop drinking once you had started?: Never 5. How often during the last year have you failed to do what was normally expected from you because of drinking?: Never 6. How often during the last year have you needed a first drink in the morning to get yourself going after a heavy drinking session?: Never 7. How often during the last year have you had a feeling of guilt of remorse after drinking?: Never 8. How often during the last year have you been unable to remember what happened the night before because you had been drinking?: Never 9. Have you or someone else been injured as a result of your drinking?: No 10. Has a relative or friend or a doctor or another health worker been concerned about your drinking or suggested you cut down?: No Alcohol Use Disorder Identification Test Final Score (AUDIT): 0 Alcohol Brief Interventions/Follow-up: Alcohol education/Brief advice  Substance Abuse History in the last 12 months:  Yes.    Consequences of Substance Abuse: Discussed with patient during this admission evaluation. Medical Consequences:  Liver damage, Possible death by overdose Legal Consequences:  Arrests, jail time, Loss of driving privilege. Family Consequences:  Family discord, divorce and or separation.  Previous Psychotropic Medications: Patient denies.  Psychological Evaluations: Yes   Past Medical History: History reviewed. No pertinent past medical history. History reviewed. No pertinent surgical history.  Family History: History reviewed. No pertinent family history.  Family Psychiatric  History: Patient currently denies any familial hx of mental health issues or substance abuse/addiction.  Tobacco Screening:  Social History   Tobacco Use  Smoking Status Some Days   Types:  Cigarettes  Smokeless Tobacco Never    BH Tobacco Counseling     Are you interested in Tobacco Cessation Medications?  No, patient refused Counseled patient on smoking cessation:  Refused/Declined practical counseling Reason Tobacco Screening Not Completed: No value filed.       Social History: Patient reports being single, has no children, unemployed, lives in [place], [place]. Social History   Substance and Sexual Activity  Alcohol Use Yes   Alcohol/week: 2.0 standard drinks of alcohol   Types: 2 Cans of beer per week   Comment: last use 12/24/22 2x/mo     Social History   Substance and Sexual Activity  Drug Use Not Currently   Types: Marijuana   Comment: last use 43 yo    Additional Social History:  Allergies:  No Known Allergies Lab Results:  No results found for this or any previous visit (from the past 48 hours).  Blood Alcohol level:  Lab Results  Component Value Date   [facility] <15 01/29/2024   Metabolic Disorder Labs:  No results found for: HGBA1C, MPG No results found for: PROLACTIN No results found for: CHOL, TRIG, HDL, CHOLHDL, VLDL, LDLCALC  Current Medications: Current Facility-Administered Medications  Medication Dose Route  Frequency Provider Last Rate Last Admin   acetaminophen  (TYLENOL ) tablet 650 mg  650 mg Oral Q6H PRN Donnelly Mellow, MD       alum & mag hydroxide-simeth (MAALOX/MYLANTA) 200-200-20 MG/5ML suspension 30 mL  30 mL Oral Q4H PRN Jadapalle, Sree, MD       diphenhydrAMINE  (BENADRYL ) capsule 25 mg  25 mg Oral Q8H PRN Rogue Rafalski I, NP       haloperidol  (HALDOL ) tablet 5 mg  5 mg Oral TID PRN Jadapalle, Sree, MD       And   diphenhydrAMINE  (BENADRYL ) capsule 50 mg  50 mg Oral TID PRN Jadapalle, Sree, MD       haloperidol  lactate (HALDOL ) injection 5 mg  5 mg Intramuscular TID PRN Jadapalle, Sree, MD       And   diphenhydrAMINE  (BENADRYL ) injection 50 mg  50 mg Intramuscular TID PRN Jadapalle, Sree, MD       And    LORazepam  (ATIVAN ) injection 2 mg  2 mg Intramuscular TID PRN Jadapalle, Sree, MD       haloperidol  lactate (HALDOL ) injection 10 mg  10 mg Intramuscular TID PRN Jadapalle, Sree, MD       And   diphenhydrAMINE  (BENADRYL ) injection 50 mg  50 mg Intramuscular TID PRN Jadapalle, Sree, MD       And   LORazepam  (ATIVAN ) injection 2 mg  2 mg Intramuscular TID PRN Jadapalle, Sree, MD       fluticasone  (FLONASE ) 50 MCG/ACT nasal spray 1 spray  1 spray Each Nare Daily Joycelyn Liska I, NP       hydrOXYzine  (ATARAX ) tablet 25 mg  25 mg Oral Q6H PRN Jadapalle, Sree, MD   25 mg at 01/30/24 2147   magnesium  hydroxide (MILK OF MAGNESIA) suspension 30 mL  30 mL Oral Daily PRN Donnelly Mellow, MD       traZODone  (DESYREL ) tablet 50 mg  50 mg Oral QHS PRN Jadapalle, Sree, MD   50 mg at 01/30/24 2147   PTA Medications: Medications Prior to Admission  Medication Sig Dispense Refill Last Dose/Taking   cetirizine (ZYRTEC) 10 MG tablet Take 10 mg by mouth as needed for allergies.      chlorhexidine  (PERIDEX ) 0.12 % solution Use as directed 15 mLs in the mouth or throat 2 (two) times daily. (Patient not taking: Reported on 10/17/2022) 120 mL 0    clindamycin  (CLEOCIN ) 150 MG capsule Take 3 capsules (450 mg total) by mouth 4 (four) times daily. (Patient not taking: Reported on 10/17/2022) 84 capsule 0    cyclobenzaprine  (FLEXERIL ) 5 MG tablet Take 1-2 tablets 3 times daily as needed (Patient not taking: Reported on 10/17/2022) 20 tablet 0    HYDROcodone -acetaminophen  (NORCO) 5-325 MG tablet Take 1 tablet by mouth every 6 (six) hours as needed for moderate pain. (Patient not taking: Reported on 10/17/2022) 15 tablet 0    ibuprofen  (ADVIL ) 600 MG tablet Take 1 tablet (600 mg total) by mouth every 6 (six) hours as needed. 30 tablet 0    AIMS:  ,  ,  ,  ,  ,  ,    Musculoskeletal: Strength & Muscle Tone: within normal limits Gait & Station: normal Patient leans: N/A  Psychiatric Specialty Exam:  Presentation  General  Appearance: Appropriate for Environment; Disheveled  Eye Contact:Good  Speech:Clear and Coherent; Normal Rate  Speech Volume:Normal  Handedness:Right   Mood and Affect  Mood:-- (Patient currently denies any symptopms of depression or anxiety.)  Affect:Congruent   Thought  Process  Thought Processes:Coherent  Duration of Psychotic Symptoms:N/A  Past Diagnosis of Schizophrenia or Psychoactive disorder: No data recorded  Descriptions of Associations:Intact  Orientation:Full (Time, Place and Person)  Thought Content:Logical  Hallucinations:Hallucinations: None  Ideas of Reference:None  Suicidal Thoughts:Suicidal Thoughts: No  Homicidal Thoughts:Homicidal Thoughts: No   Sensorium  Memory:Immediate Good; Recent Good; Remote Good  Judgment:Poor  Insight:Poor   Executive Functions  Concentration:Fair  Attention Span:Fair  Recall:Fair  Fund of Knowledge:Fair  Language:Good   Psychomotor Activity  Psychomotor Activity:Psychomotor Activity: Normal   Assets  Assets:Communication Skills; Housing; Physical Health; Social Support   Sleep  Sleep:Sleep: Good  Estimated Sleeping Duration (Last 24 Hours): 5.25-6.25 hours  Physical Exam: Physical Exam HENT:     Head: Normocephalic.     Nose: Nose normal.     Mouth/Throat:     Pharynx: Oropharynx is clear.  Cardiovascular:     Rate and Rhythm: Normal rate.     Pulses: Normal pulses.  Pulmonary:     Effort: Pulmonary effort is normal.  Genitourinary:    Comments: Deferred Musculoskeletal:        General: Normal range of motion.     Cervical back: Normal range of motion.  Skin:    General: Skin is dry.  Neurological:     General: No focal deficit present.     Mental Status: She is alert and oriented to person, place, and time.    Review of Systems  Constitutional:  Negative for chills, diaphoresis and fever.  HENT:  Negative for congestion and sore throat.   Respiratory:  Negative for cough,  shortness of breath and wheezing.   Cardiovascular:  Negative for chest pain and palpitations.  Genitourinary:  Negative for dysuria.  Musculoskeletal:  Negative for joint pain and myalgias.  Endo/Heme/Allergies:        NKDA  Psychiatric/Behavioral:  Positive for substance abuse (UDS (+) for cocaine, THC & TCA.). Negative for memory loss.    Blood pressure 128/81, pulse 87, temperature 97.6 F (36.4 C), temperature source Oral, resp. rate 18, height 5' 7 (1.702 m), weight 57 kg, SpO2 100%. Body mass index is 19.67 kg/m.  Treatment Plan Summary: Daily contact with patient to assess and evaluate symptoms and progress in treatment and Medication management.   Principal/active diagnoses.  MDD (major depressive disorder), recurrent severe, without psychosis.  Plan: The risks/benefits/side-effects/alternatives to the medications in use were discussed in detail with the patient and time was given for patient's questions. The patient consents to medication trial.   -Continue Hydroxyzine  25 mg po qid prn for anxiety.  -Continue Trazodone  50 mg po Q hs prn for insomnia.   Agitation protocols.  -Continue as recommended (see MAR).  Other PRNS -Continue Tylenol  650 mg every 6 hours PRN for mild pain -Continue Maalox 30 ml Q 4 hrs PRN for indigestion -Continue MOM 30 ml po Q 6 hrs for constipation  Safety and Monitoring: Voluntary admission to inpatient psychiatric unit for safety, stabilization and treatment Daily contact with patient to assess and evaluate symptoms and progress in treatment Patient's case to be discussed in multi-disciplinary team meeting Observation Level : q15 minute checks Vital signs: q12 hours Precautions: Safety  Discharge Planning: Social work and case management to assist with discharge planning and identification of hospital follow-up needs prior to discharge Estimated LOS: 5-7 days Discharge Concerns: Need to establish a safety plan; Medication compliance and  effectiveness Discharge Goals: Return home with outpatient referrals for mental health follow-up including medication management/psychotherapy  Observation  Level/Precautions:  15 minute checks  Laboratory:  Per ED. Current lab results reviewed. Will obtain lipid panel, TSH, Hgba1c.  Psychotherapy:  Enrolled in the group sessions.  Medications: See MAR.   Consultations: As needed.    Discharge Concerns: Safety, mood stability.  Estimated LOS: 3-5 days.  Other: NA    Physician Treatment Plan for Primary Diagnosis: MDD (major depressive disorder), recurrent severe, without psychosis (HCC)  Long Term Goal(s): Improvement in symptoms so as ready for discharge  Short Term Goals: Ability to identify changes in lifestyle to reduce recurrence of condition will improve, Ability to verbalize feelings will improve, Ability to disclose and discuss suicidal ideas, and Ability to demonstrate self-control will improve  Physician Treatment Plan for Secondary Diagnosis: Principal Problem:   MDD (major depressive disorder), recurrent severe, without psychosis (HCC)  Long Term Goal(s): Improvement in symptoms so as ready for discharge  Short Term Goals: Ability to identify and develop effective coping behaviors will improve, Ability to maintain clinical measurements within normal limits will improve, Compliance with prescribed medications will improve, and Ability to identify triggers associated with substance abuse/mental health issues will improve  I certify that inpatient services furnished can reasonably be expected to improve the patient's condition.    Mac Bolster, NP, pmhnp, fnp-bc. 9/3/202512:37 PM

## 2024-01-31 NOTE — Plan of Care (Signed)
   Problem: Education: Goal: Emotional status will improve Outcome: Progressing Goal: Mental status will improve Outcome: Progressing

## 2024-01-31 NOTE — BHH Counselor (Signed)
 Adult Comprehensive Assessment  Patient ID: Sharon Schmitt, female   DOB: 11-Dec-1980, 43 y.o.   MRN: [id number]  Information Source: Information source: Patient  Current Stressors:  Patient states their primary concerns and needs for treatment are:: Me and my mom had a disagreement. I made a facebook post saying this would be my last post but she thought that meant I would kill myself. She took it out of context. I wasn't planning to hurt myself or anyone else. I wasn't going to put any information on facebook anymore for anyone to talk about. Patient states their goals for this hospitilization and ongoing recovery are:: watching what I say and how I react because every reaction determines a lot, being a better version of myself. Educational / Learning stressors: None reported Employment / Job issues: None reported Family Relationships: Things are sometimes rocky between me and my mom but that's with every relationship in life. Everybody deals with that. Financial / Lack of resources (include bankruptcy): None reported Housing / Lack of housing: None reported Physical health (include injuries & life threatening diseases): None reported Social relationships: None reported Substance abuse: Patient denied the use of illicit, mood-altering substances during assessment, however, UDS was positive for tricyclic, cocaine, and cannabinoids. Bereavement / Loss: None reported  Living/Environment/Situation:  Living Arrangements: Parent Living conditions (as described by patient or guardian): I live with my mom Who else lives in the home?: me, my mom, and my little cousin comes in between classes but other than that it's just me and her How long has patient lived in current situation?: a couple of years What is atmosphere in current home: Loving, Comfortable  Family History:  Marital status: Single Are you sexually active?: No What is your sexual orientation?: Heterosexual Has your  sexual activity been affected by drugs, alcohol, medication, or emotional stress?: No Does patient have children?: No  Childhood History:  By whom was/is the patient raised?: Both parents Additional childhood history information: Dad lives in [place] but is from [place] . Our relationship is pretty good and I want to see him more. Description of patient's relationship with caregiver when they were a child: It was pretty good Patient's description of current relationship with people who raised him/her: We have our ups and downs but more ups than downs How were you disciplined when you got in trouble as a child/adolescent?: take away privileges Does patient have siblings?: No Did patient suffer any verbal/emotional/physical/sexual abuse as a child?: No Did patient suffer from severe childhood neglect?: No Has patient ever been sexually abused/assaulted/raped as an adolescent or adult?: No Was the patient ever a victim of a crime or a disaster?: No Witnessed domestic violence?: No Has patient been affected by domestic violence as an adult?: No  Education:  Highest grade of school patient has completed: Some college Currently a Consulting civil engineer?: No Learning disability?: No  Employment/Work Situation:   Employment Situation: Unemployed Patient's Job has Been Impacted by Current Illness: No What is the Longest Time Patient has Held a Job?: 3-4 years 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?: No  Financial Resources:   Surveyor, quantity resources: Support from parents / caregiver Does patient have a Lawyer or guardian?: No  Alcohol/Substance Abuse:   What has been your use of drugs/alcohol within the last 12 months?: Maybe 1-2 cigarettes per week If attempted suicide, did drugs/alcohol play a role in this?: No Alcohol/Substance Abuse Treatment Hx: Denies past history  If yes, describe treatment: N/A Has alcohol/substance  abuse ever caused legal problems?: No  Social Support System:   Describe Community Support System: parents and I have friends who are good people that care about me Type of faith/religion: I believe in God How does patient's faith help to cope with current illness?: It helps a great deal  Leisure/Recreation:   Do You Have Hobbies?: Yes Leisure and Hobbies: I sing and rap, volunteering, cooking, I do a lot of activist work  Strengths/Needs:   What is the patient's perception of their strengths?: being able to help others and being able to listen and be a team player Patient states they can use these personal strengths during their treatment to contribute to their recovery: I think they will keep me on the right path. Patient states these barriers may affect/interfere with their treatment: None reported Patient states these barriers may affect their return to the community: None reported Other important information patient would like considered in planning for their treatment: Patient denied having outpatient mental health providers and will need follow-up appointments prior to discharge.  Discharge Plan:   Currently receiving community mental health services: No Patient states concerns and preferences for aftercare planning are: None reported Patient states they will know when they are safe and ready for discharge when: I feel ready now but maybe a few days here will be good for me. Does patient have access to transportation?: Yes Does patient have financial barriers related to discharge medications?: Yes Patient description of barriers related to discharge medications: None reported Will patient be returning to same living situation after discharge?: Yes  Summary/Recommendations:   Summary and Recommendations (to be completed by the evaluator): Onedia K. Iwai is a 43 year old female who was involuntarily admitted to [facility] from [facility] due to  concerns from her mother that she would end her life. Patient reportedly posted on facebook that this would be her last post. Upon assessment, patient stated she didn't mean that she would end her life, but that she would no longer be posting on the facebook platform. She stated her mother took the post out of context and IVC'd her because of it. Patient denied the use of illicit, mood-altering substances including the consumption of alcohol. Patient's urinary drug screen was positive for tricyclic, cocaine, and cannabinoids. During assessment, patient was calm and cooperative. She denied current engagment in outpatient mental health treatment but reported having interest in therapy and medication management. CSW team to provide patient with outpatient follw-up appointments prior to discharge.While here, Leonette can benefit from crisis stabilization, medication management, therapeutic milieu, and referrals for services.   Makail Watling M Day Greb, LCSWA 01/31/2024

## 2024-01-31 NOTE — Group Note (Signed)
 Date:  01/31/2024 Time:  5:11 PM  Group Topic/Focus:  Making Healthy Choices:   The focus of this group is to help patients identify negative/unhealthy choices they were using prior to admission and identify positive/healthier coping strategies to replace them upon discharge.    Participation Level:  Active  Participation Quality:  Appropriate  Affect:  Appropriate  Cognitive:  Appropriate  Insight: Appropriate  Engagement in Group:  Engaged  Modes of Intervention:  Discussion   Annalee  Georgiana Spillane 01/31/2024, 5:11 PM

## 2024-01-31 NOTE — Group Note (Signed)
 Date:  01/31/2024 Time:  10:17 AM  Group Topic/Focus:  Goals Group:   The focus of this group is to help patients establish daily goals to achieve during treatment and discuss how the patient can incorporate goal setting into their daily lives to aide in recovery.    Participation Level:  Active  Participation Quality:  Appropriate  Affect:  Appropriate  Cognitive:  Appropriate  Insight: Appropriate  Engagement in Group:  Engaged  Modes of Intervention:  Discussion  Additional Comments:  NA  Sharon Schmitt [name] 01/31/2024, 10:17 AM

## 2024-01-31 NOTE — Group Note (Signed)
 Date:  01/31/2024 Time:  9:12 PM  Group Topic/Focus:  Wrap-Up Group:   The focus of this group is to help patients review their daily goal of treatment and discuss progress on daily workbooks.    Additional Comments:  Pt attended the NA meeting without any disruptions.   Rosella DELENA Pouch 01/31/2024, 9:12 PM

## 2024-01-31 NOTE — B[facility] Group Notes (Signed)

## 2024-01-31 NOTE — Progress Notes (Signed)
   01/30/24 2030  Psych Admission Type (Psych Patients Only)  Admission Status Involuntary  Psychosocial Assessment  Patient Complaints Hyperactivity  Eye Contact Fair  Facial Expression Flat  Affect Appropriate to circumstance  Speech Rapid  Interaction Minimal  Motor Activity Other (Comment) (WNL)  Appearance/Hygiene In scrubs  Behavior Characteristics Appropriate to situation;Restless  Mood Pleasant  Thought Process  Coherency Concrete thinking  Content Blaming others  Delusions WDL  Perception WDL  Hallucination None reported or observed  Judgment Impaired  Confusion None  Danger to Self  Current suicidal ideation? Passive (Denies)  Danger to Others  Danger to Others None reported or observed

## 2024-01-31 NOTE — Plan of Care (Signed)
  Problem: Education: Goal: Knowledge of [facility] Education information/materials will improve Outcome: Progressing Goal: Emotional status will improve 01/31/2024 1900 by Elouise Wolm CROME, RN Outcome: Progressing 01/31/2024 1900 by Elouise Wolm CROME, RN Outcome: Progressing Goal: Mental status will improve 01/31/2024 1900 by Elouise Wolm CROME, RN Outcome: Progressing 01/31/2024 1900 by Elouise Wolm CROME, RN Outcome: Progressing   Problem: Activity: Goal: Interest or engagement in activities will improve 01/31/2024 1900 by Elouise Wolm CROME, RN Outcome: Progressing 01/31/2024 1900 by Elouise Wolm CROME, RN Outcome: Progressing

## 2024-01-31 NOTE — [facility] Suicide Risk Assessment (Signed)
 [facility] INPATIENT:  Family/Significant Other Suicide Prevention Education  Suicide Prevention Education:  Education Completed; Alixandra Alfieri (mother) [phone],  (name of family member/significant other) has been identified by the patient as the family member/significant other with whom the patient will be residing, and identified as the person(s) who will aid the patient in the event of a mental health crisis (suicidal ideations/suicide attempt).  With written consent from the patient, the family member/significant other has been provided the following suicide prevention education, prior to the and/or following the discharge of the patient.  [name] confirmed patient will not have access to firearms/guns/weapons to harm herself or others. [name] reported no safety concerns related to patient discharging back to her residence once stable. [name] reported she wants the patient to get the help she needs as she really cares about her daughter and wants the best for her. [name] reported being able to support the patient should she have a mental health crisis.    The suicide prevention education provided includes the following: Suicide risk factors Suicide prevention and interventions National Suicide Hotline telephone number [facility] assessment telephone number [facility] Emergency Assistance 911 [facility] and/or Residential Mobile Crisis Unit telephone number  Request made of family/significant other to: Remove weapons (e.g., guns, rifles, knives), all items previously/currently identified as safety concern.   Remove drugs/medications (over-the-counter, prescriptions, illicit drugs), all items previously/currently identified as a safety concern.  The family member/significant other verbalizes understanding of the suicide prevention education information provided.  The family member/significant other agrees to remove the items of safety concern listed above.  Bohdi Leeds M Manasvi Dickard,  LCSWA 01/31/2024, 3:17 PM

## 2024-01-31 NOTE — Group Note (Signed)
 Recreation Therapy Group Note   Group Topic:Problem Solving  Group Date: 01/31/2024 Start Time: 0935 End Time: 1005 Facilitators: Sheniya Garciaperez-McCall, LRT,CTRS Location: [address] Dayroom   Group Topic: Communication, Team Building, Problem Solving  Goal Area(s) Addresses:  Patient will effectively work with peer towards shared goal.  Patient will identify skills used to make activity successful.  Patient will identify how skills used during activity can be used to reach post d/c goals.   Behavioral Response:   Intervention: STEM Activity  Activity: Landing Pad. In teams of 3-5, patients were given 12 plastic drinking straws and an equal length of masking tape. Using the materials provided, patients were asked to build a landing pad to catch a golf ball dropped from approximately 5 feet in the air. All materials were required to be used by the team in their design. LRT facilitated post-activity discussion.  Education: Pharmacist, community, Scientist, physiological, Discharge Planning   Education Outcome: Acknowledges education/In group clarification offered/Needs additional education.    Affect/Mood: N/A   Participation Level: Did not attend    Clinical Observations/Individualized Feedback:      Plan: Continue to engage patient in RT group sessions 2-3x/week.   Yeng Perz-McCall, LRT,CTRS 01/31/2024 12:25 PM

## 2024-01-31 NOTE — Plan of Care (Signed)
   Problem: Education: Goal: Knowledge of [facility] Education information/materials will improve Outcome: Progressing Goal: Emotional status will improve Outcome: Progressing Goal: Mental status will improve Outcome: Progressing Goal: Verbalization of understanding the information provided will improve Outcome: Progressing

## 2024-01-31 NOTE — BH IP Treatment Plan (Signed)
 Interdisciplinary Treatment and Diagnostic Plan Update  01/31/2024 Time of Session: 10:55 AM Sharon Schmitt MRN: [id number]  Principal Diagnosis: MDD (major depressive disorder), recurrent severe, without psychosis (HCC)  Secondary Diagnoses: Principal Problem:   MDD (major depressive disorder), recurrent severe, without psychosis (HCC)   Current Medications:  Current Facility-Administered Medications  Medication Dose Route Frequency Provider Last Rate Last Admin   acetaminophen  (TYLENOL ) tablet 650 mg  650 mg Oral Q6H PRN Jadapalle, Sree, MD       alum & mag hydroxide-simeth (MAALOX/MYLANTA) 200-200-20 MG/5ML suspension 30 mL  30 mL Oral Q4H PRN Jadapalle, Sree, MD       diphenhydrAMINE  (BENADRYL ) capsule 25 mg  25 mg Oral Q8H PRN Collene Gouge I, NP   25 mg at 01/31/24 1248   haloperidol  (HALDOL ) tablet 5 mg  5 mg Oral TID PRN Jadapalle, Sree, MD       And   diphenhydrAMINE  (BENADRYL ) capsule 50 mg  50 mg Oral TID PRN Jadapalle, Sree, MD       haloperidol  lactate (HALDOL ) injection 5 mg  5 mg Intramuscular TID PRN Jadapalle, Sree, MD       And   diphenhydrAMINE  (BENADRYL ) injection 50 mg  50 mg Intramuscular TID PRN Jadapalle, Sree, MD       And   LORazepam  (ATIVAN ) injection 2 mg  2 mg Intramuscular TID PRN Jadapalle, Sree, MD       haloperidol  lactate (HALDOL ) injection 10 mg  10 mg Intramuscular TID PRN Jadapalle, Sree, MD       And   diphenhydrAMINE  (BENADRYL ) injection 50 mg  50 mg Intramuscular TID PRN Jadapalle, Sree, MD       And   LORazepam  (ATIVAN ) injection 2 mg  2 mg Intramuscular TID PRN Jadapalle, Sree, MD       fluticasone  (FLONASE ) 50 MCG/ACT nasal spray 1 spray  1 spray Each Nare Daily Nwoko, Agnes I, NP       hydrOXYzine  (ATARAX ) tablet 25 mg  25 mg Oral Q6H PRN Jadapalle, Sree, MD   25 mg at 01/30/24 2147   magnesium  hydroxide (MILK OF MAGNESIA) suspension 30 mL  30 mL Oral Daily PRN Jadapalle, Sree, MD       traZODone  (DESYREL ) tablet 50 mg  50 mg Oral QHS PRN  Jadapalle, Sree, MD   50 mg at 01/30/24 2147   PTA Medications: Medications Prior to Admission  Medication Sig Dispense Refill Last Dose/Taking   cetirizine (ZYRTEC) 10 MG tablet Take 10 mg by mouth as needed for allergies.      chlorhexidine  (PERIDEX ) 0.12 % solution Use as directed 15 mLs in the mouth or throat 2 (two) times daily. (Patient not taking: Reported on 10/17/2022) 120 mL 0    clindamycin  (CLEOCIN ) 150 MG capsule Take 3 capsules (450 mg total) by mouth 4 (four) times daily. (Patient not taking: Reported on 10/17/2022) 84 capsule 0    cyclobenzaprine  (FLEXERIL ) 5 MG tablet Take 1-2 tablets 3 times daily as needed (Patient not taking: Reported on 10/17/2022) 20 tablet 0    HYDROcodone -acetaminophen  (NORCO) 5-325 MG tablet Take 1 tablet by mouth every 6 (six) hours as needed for moderate pain. (Patient not taking: Reported on 10/17/2022) 15 tablet 0    ibuprofen  (ADVIL ) 600 MG tablet Take 1 tablet (600 mg total) by mouth every 6 (six) hours as needed. 30 tablet 0     Patient Stressors:    Patient Strengths:    Treatment Modalities: Medication Management, Group therapy, Case management,  1 to 1 session with clinician, Psychoeducation, Recreational therapy.   Physician Treatment Plan for Primary Diagnosis: MDD (major depressive disorder), recurrent severe, without psychosis (HCC) Long Term Goal(s): Improvement in symptoms so as ready for discharge   Short Term Goals: Ability to identify and develop effective coping behaviors will improve Ability to maintain clinical measurements within normal limits will improve Compliance with prescribed medications will improve Ability to identify triggers associated with substance abuse/mental health issues will improve Ability to identify changes in lifestyle to reduce recurrence of condition will improve Ability to verbalize feelings will improve Ability to disclose and discuss suicidal ideas Ability to demonstrate self-control will  improve  Medication Management: Evaluate patient's response, side effects, and tolerance of medication regimen.  Therapeutic Interventions: 1 to 1 sessions, Unit Group sessions and Medication administration.  Evaluation of Outcomes: Not Progressing  Physician Treatment Plan for Secondary Diagnosis: Principal Problem:   MDD (major depressive disorder), recurrent severe, without psychosis (HCC)  Long Term Goal(s): Improvement in symptoms so as ready for discharge   Short Term Goals: Ability to identify and develop effective coping behaviors will improve Ability to maintain clinical measurements within normal limits will improve Compliance with prescribed medications will improve Ability to identify triggers associated with substance abuse/mental health issues will improve Ability to identify changes in lifestyle to reduce recurrence of condition will improve Ability to verbalize feelings will improve Ability to disclose and discuss suicidal ideas Ability to demonstrate self-control will improve     Medication Management: Evaluate patient's response, side effects, and tolerance of medication regimen.  Therapeutic Interventions: 1 to 1 sessions, Unit Group sessions and Medication administration.  Evaluation of Outcomes: Not Progressing   RN Treatment Plan for Primary Diagnosis: MDD (major depressive disorder), recurrent severe, without psychosis (HCC) Long Term Goal(s): Knowledge of disease and therapeutic regimen to maintain health will improve  Short Term Goals: Ability to remain free from injury will improve, Ability to verbalize frustration and anger appropriately will improve, Ability to demonstrate self-control, Ability to participate in decision making will improve, Ability to verbalize feelings will improve, Ability to disclose and discuss suicidal ideas, Ability to identify and develop effective coping behaviors will improve, and Compliance with prescribed medications will  improve  Medication Management: RN will administer medications as ordered by provider, will assess and evaluate patient's response and provide education to patient for prescribed medication. RN will report any adverse and/or side effects to prescribing provider.  Therapeutic Interventions: 1 on 1 counseling sessions, Psychoeducation, Medication administration, Evaluate responses to treatment, Monitor vital signs and CBGs as ordered, Perform/monitor CIWA, COWS, AIMS and Fall Risk screenings as ordered, Perform wound care treatments as ordered.  Evaluation of Outcomes: Not Progressing   LCSW Treatment Plan for Primary Diagnosis: MDD (major depressive disorder), recurrent severe, without psychosis (HCC) Long Term Goal(s): Safe transition to appropriate next level of care at discharge, Engage patient in therapeutic group addressing interpersonal concerns.  Short Term Goals: Engage patient in aftercare planning with referrals and resources, Increase social support, Increase ability to appropriately verbalize feelings, Increase emotional regulation, Facilitate acceptance of mental health diagnosis and concerns, Facilitate patient progression through stages of change regarding substance use diagnoses and concerns, Identify triggers associated with mental health/substance abuse issues, and Increase skills for wellness and recovery  Therapeutic Interventions: Assess for all discharge needs, 1 to 1 time with Social worker, Explore available resources and support systems, Assess for adequacy in community support network, Educate family and significant other(s) on suicide prevention, Complete Psychosocial Assessment,  Interpersonal group therapy.  Evaluation of Outcomes: Not Progressing   Progress in Treatment: Attending groups: Yes. Participating in groups: Yes. Taking medication as prescribed: patient hasn't been prescribed any medications yet Toleration medication: N/A Family/Significant other contact  made: Yes, individual(s) contacted:  Harini Dearmond (mother) [phone] Patient understands diagnosis: Yes. Discussing patient identified problems/goals with staff: Yes. Medical problems stabilized or resolved: Yes. Denies suicidal/homicidal ideation: Yes. Issues/concerns per patient self-inventory: No.  New problem(s) identified:   No  New Short Term/Long Term Goal(s):    medication stabilization, elimination of SI thoughts, development of comprehensive mental wellness plan.    Patient Goals:  I want to follow through with everything I start.  I want to become a better version of myself by absorbing everything positive that I can apply to everyday life and every situation I'm faced with.    Discharge Plan or Barriers:  Patient recently admitted. CSW will continue to follow and assess for appropriate referrals and possible discharge planning.    Reason for Continuation of Hospitalization: Depression Medication stabilization Suicidal ideation  Estimated Length of Stay:  5 - 7 days  Last 3 [place] Suicide Severity Risk Score: Flowsheet Row [facility] (Current[facility] from 01[facility]29/2024 in [facility] Department at [facility] ED from 11/07/2022 in [facility] Emergency Department at [facility]  C-SSRS RISK CATEGORY No Risk No Risk No Risk    Last PHQ 2/9 Scores:     No data to display          Scribe for Treatment Team: Takahiro Godinho O Jennica Tagliaferri, LCSWA 01/31/2024 7:27 PM

## 2024-01-31 NOTE — [facility] Suicide Risk Assessment (Signed)
 Suicide Risk Assessment  Admission Assessment    [facility] Admission Suicide Risk Assessment   Nursing information obtained from:  Patient  Demographic factors:  Unemployed  Current Mental Status: Alert, oriented x 3 & aware of situation.  Loss Factors:  NA  Historical Factors:  NA  Risk Reduction Factors:  Religious beliefs about death, Living with another person, especially a relative  Total Time spent with patient: 1.5 hours  Principal Problem: MDD (major depressive disorder), recurrent severe, without psychosis (HCC)  Diagnosis:  Principal Problem:   MDD (major depressive disorder), recurrent severe, without psychosis (HCC)  Subjective Data: See H&P.  Continued Clinical Symptoms:  Alcohol Use Disorder Identification Test Final Score (AUDIT): 0 The Alcohol Use Disorders Identification Test, Guidelines for Use in Primary Care, Second Edition.  World Science writer [facility]). Score between 0-7:  no or low risk or alcohol related problems. Score between 8-15:  moderate risk of alcohol related problems. Score between 16-19:  high risk of alcohol related problems. Score 20 or above:  warrants further diagnostic evaluation for alcohol dependence and treatment.  CLINICAL FACTORS:   Alcohol/Substance Abuse/Dependencies  Musculoskeletal: Strength & Muscle Tone: within normal limits Gait & Station: normal Patient leans: N/A  Psychiatric Specialty Exam:  Presentation  General Appearance: Appropriate for Environment; Disheveled  Eye Contact:Good  Speech:Clear and Coherent; Normal Rate  Speech Volume:Normal  Handedness:Right   Mood and Affect  Mood:-- (Patient currently denies any symptopms of depression or anxiety.)  Affect:Congruent   Thought Process  Thought Processes:Coherent  Descriptions of Associations:Intact  Orientation:Full (Time, Place and Person)  Thought Content:Logical  History of Schizophrenia/Schizoaffective disorder: no  Duration of  Psychotic Symptoms: NA  Hallucinations:Hallucinations: None  Ideas of Reference:None  Suicidal Thoughts:Suicidal Thoughts: No  Homicidal Thoughts:Homicidal Thoughts: No   Sensorium  Memory:Immediate Good; Recent Good; Remote Good  Judgment:Poor  Insight:Poor   Executive Functions  Concentration:Fair  Attention Span:Fair  Recall:Fair  Fund of Knowledge:Fair  Language:Good   Psychomotor Activity  Psychomotor Activity:Psychomotor Activity: Normal   Assets  Assets:Communication Skills; Housing; Physical Health; Social Support   Sleep  Sleep:Sleep: Good Number of Hours of Sleep: 7.5    Physical Exam: See H&P.  Blood pressure 128/81, pulse 87, temperature 97.6 F (36.4 C), temperature source Oral, resp. rate 18, height 5' 7 (1.702 m), weight 57 kg, SpO2 100%. Body mass index is 19.67 kg/m.  COGNITIVE FEATURES THAT CONTRIBUTE TO RISK:  Closed-mindedness, Polarized thinking, and Thought constriction (tunnel vision)    SUICIDE RISK:   Moderate:  Frequent suicidal ideation with limited intensity, and duration, some specificity in terms of plans, no associated intent, good self-control, limited dysphoria/symptomatology, some risk factors present, and identifiable protective factors, including available and accessible social support.  PLAN OF CARE: See H&P.  I certify that inpatient services furnished can reasonably be expected to improve the patient's condition.   Mac Bolster, NP, pmhnp, fnp-bc. 01/31/2024, 12:36 PM

## 2024-01-31 NOTE — BH Assessment (Signed)
(  Sleep Hours) - 6.5 (Any PRNs that were needed, meds refused, or side effects to meds)- Hydroxyzine  25 mg, and Trazodone  50 mg PO (Any disturbances and when (visitation, over night)- None (Concerns raised by the patient)- Pt said, My Mom saw my post on FB saying I will not be posting on FB anymore and assumed I meant I wanted to hurt myself. I was saying I would not be on social media. (SI/HI/AVH)- Denies

## 2024-02-01 LAB — TSH: TSH: 3.53 u[IU]/mL (ref 0.350–4.500)

## 2024-02-01 LAB — HEMOGLOBIN A1C
Hgb A1c MFr Bld: 5.1 % (ref 4.8–5.6)
Mean Plasma Glucose: 99.67 mg/dL

## 2024-02-01 LAB — LIPID PANEL
Cholesterol: 214 mg/dL — ABNORMAL HIGH (ref 0–200)
HDL: 70 mg/dL (ref >40)
LDL Cholesterol: 126 mg/dL — ABNORMAL HIGH (ref 0–99)
Total CHOL/HDL Ratio: 3.1 ratio
Triglycerides: 91 mg/dL (ref <150)
VLDL: 18 mg/dL (ref 0–40)

## 2024-02-01 NOTE — Plan of Care (Signed)
  Problem: Activity: Goal: Interest or engagement in activities will improve Outcome: Progressing Goal: Sleeping patterns will improve Outcome: Progressing   Problem: Education: Goal: Mental status will improve Outcome: Progressing

## 2024-02-01 NOTE — Progress Notes (Signed)
 [facility] MD Progress Note  02/01/2024 10:32 AM Sharon Schmitt  MRN:  [id number]  Reason for admission: 43 yesr olf AA female with probable hx of mental illness & substance use disorder. Admitted to the [facility] from the [facility] with complaint of suicidal ideations after she posted a message on social media saying no body will hear from her anymore. Apparently, this patient suffered a miscarriage 3-4 months ago. Patient's mother involuntarily committed patient to the hospital for psychiatric evaluation because of patient's social media post.   Daily notes: Sharon Schmitt is seen this morning. Chart reviewed. The chart findings discussed with the team this afternoon. She presents alert, oriented & aware of situation. She is visible on the unit, attending group sessions. She reports, I'm doing very well, feeling very positive about everything. I slept well last night. I'm still wondering when I will be let go from this place. I'm ready to be discharged. I have a new job offer to start very soon. I have a pending court date for drug paraphernalia charge. I was charged with a possession of something that does not even belong to me. The court date is on the 13th of this month. I cannot miss this court date. I'm still not sure how you guy found cocaine in my system because I have not been abusing any kind of drugs. Also, I have learned my lesson to not post anything like I did on the internet. Again my mother took things out of context. I did not want to hurt myself. I was only telling people that I'm not going to be posting stuff on the internet any more. I'm willing to go to an outpatient substance abuse treatment. I do not want to go to long term or a residential treatment center. I'm not depressed or anxious. I do not hear voices or see things. I'm good. Patient currently denies any SIHI, AVH, delusional thoughts or paranoia. She does not appear to be responding to any internal stimuli.  Collateral information obtained from  patient's mother: Ms. [name] reports, Charmaine has been abusing alcohol & drugs for a least 7 years now. The effects of drugs & alcohol has affected her ability to make appropriate decisions. [place] because of drugs & alcohol, lies & manipulates. She is no longer able to tell the truth. She only tells you what she wants you to hear. Talonda recently posted on social media that she was going to kill herself. That was the the reason I went & IVC'ed her to the hospital. I Know & love my daughter, but I do not know who this Sreeja is. And if Lynessa decides to not go to a residential treatment center for her substance addictions, she will not be coming home to this house. Ps, make sure you let her know that she will not be coming back to this house if she did not go to a drug rehab.  Principal Problem: MDD (major depressive disorder), recurrent severe, without psychosis (HCC)  Diagnosis: Principal Problem:   MDD (major depressive disorder), recurrent severe, without psychosis (HCC)  Total Time spent with patient: 1.5 hours  Past Psychiatric History: See H&P.  Past Medical History: History reviewed. No pertinent past medical history. History reviewed. No pertinent surgical history.  Family History: History reviewed. No pertinent family history.  Family Psychiatric  History: See H&P.  Social History:  Social History   Substance and Sexual Activity  Alcohol Use Yes   Alcohol/week: 2.0 standard drinks of alcohol   Types: 2  Cans of beer per week   Comment: last use 12/24/22 2x/mo     Social History   Substance and Sexual Activity  Drug Use Not Currently   Types: Marijuana   Comment: last use 43 yo    Social History   Socioeconomic History   Marital status: Single    Spouse name: Not on file   Number of children: Not on file   Years of education: Not on file   Highest education level: Not on file  Occupational History   Not on file  Tobacco Use   Smoking status: Some Days     Types: Cigarettes   Smokeless tobacco: Never  Substance and Sexual Activity   Alcohol use: Yes    Alcohol/week: 2.0 standard drinks of alcohol    Types: 2 Cans of beer per week    Comment: last use 12/24/22 2x/mo   Drug use: Not Currently    Types: Marijuana    Comment: last use 43 yo   Sexual activity: Yes    Partners: Male  Other Topics Concern   Not on file  Social History Narrative   Not on file   Social Drivers of Health   Financial Resource Strain: Not on file  Food Insecurity: No Food Insecurity (01/30/2024)   Hunger Vital Sign    Worried About Running Out of Food in the Last Year: Never true    Ran Out of Food in the Last Year: Never true  Transportation Needs: No Transportation Needs (01/30/2024)   PRAPARE - Administrator, Civil Service (Medical): No    Lack of Transportation (Non-Medical): No  Physical Activity: Not on file  Stress: Not on file  Social Connections: Not on file   Additional Social History:   Sleep: Good Estimated Sleeping Duration (Last 24 Hours): 6.50-7.00 hours  Appetite:  Good  Current Medications: Current Facility-Administered Medications  Medication Dose Route Frequency Provider Last Rate Last Admin   acetaminophen  (TYLENOL ) tablet 650 mg  650 mg Oral Q6H PRN Jadapalle, Sree, MD       alum & mag hydroxide-simeth (MAALOX/MYLANTA) 200-200-20 MG/5ML suspension 30 mL  30 mL Oral Q4H PRN Jadapalle, Sree, MD       diphenhydrAMINE  (BENADRYL ) capsule 25 mg  25 mg Oral Q8H PRN Collene Gouge I, NP   25 mg at 02/01/24 9188   haloperidol  (HALDOL ) tablet 5 mg  5 mg Oral TID PRN Jadapalle, Sree, MD       And   diphenhydrAMINE  (BENADRYL ) capsule 50 mg  50 mg Oral TID PRN Jadapalle, Sree, MD   50 mg at 01/31/24 2211   haloperidol  lactate (HALDOL ) injection 5 mg  5 mg Intramuscular TID PRN Jadapalle, Sree, MD       And   diphenhydrAMINE  (BENADRYL ) injection 50 mg  50 mg Intramuscular TID PRN Jadapalle, Sree, MD       And   LORazepam  (ATIVAN )  injection 2 mg  2 mg Intramuscular TID PRN Jadapalle, Sree, MD       haloperidol  lactate (HALDOL ) injection 10 mg  10 mg Intramuscular TID PRN Jadapalle, Sree, MD       And   diphenhydrAMINE  (BENADRYL ) injection 50 mg  50 mg Intramuscular TID PRN Jadapalle, Sree, MD       And   LORazepam  (ATIVAN ) injection 2 mg  2 mg Intramuscular TID PRN Donnelly Mellow, MD       fluticasone  (FLONASE ) 50 MCG/ACT nasal spray 1 spray  1 spray Each Nare Daily  Collene Gouge I, NP       hydrOXYzine  (ATARAX ) tablet 25 mg  25 mg Oral Q6H PRN Jadapalle, Sree, MD   25 mg at 01/30/24 2147   magnesium  hydroxide (MILK OF MAGNESIA) suspension 30 mL  30 mL Oral Daily PRN Jadapalle, Sree, MD       traZODone  (DESYREL ) tablet 50 mg  50 mg Oral QHS PRN Jadapalle, Sree, MD   50 mg at 01/31/24 2111   Lab Results:  Results for orders placed or performed during the hospital encounter of 01/30/24 (from the past 48 hours)  Lipid panel     Status: Abnormal   Collection Time: 02/01/24  6:23 AM  Result Value Ref Range   Cholesterol 214 (H) 0 - 200 mg/dL    Comment:        ATP III CLASSIFICATION:  <200     mg/dL   Desirable  799-760  mg/dL   Borderline High  >=759    mg/dL   High           Triglycerides 91 <150 mg/dL   HDL 70 >59 mg/dL   Total CHOL/HDL Ratio 3.1 RATIO   VLDL 18 0 - 40 mg/dL   LDL Cholesterol 873 (H) 0 - 99 mg/dL    Comment:        Total Cholesterol/HDL:CHD Risk Coronary Heart Disease Risk Table                     Men   Women  1/2 Average Risk   3.4   3.3  Average Risk       5.0   4.4  2 X Average Risk   9.6   7.1  3 X Average Risk  23.4   11.0        Use the calculated Patient Ratio above and the CHD Risk Table to determine the patient's CHD Risk.        ATP III CLASSIFICATION (LDL):  <100     mg/dL   Optimal  899-870  mg/dL   Near or Above                    Optimal  130-159  mg/dL   Borderline  839-810  mg/dL   High  >809     mg/dL   Very High Performed at [facility], [address], [place], [place] [address]   TSH     Status: None   Collection Time: 02/01/24  6:23 AM  Result Value Ref Range   TSH 3.530 0.350 - 4.500 uIU/mL    Comment: Performed at [facility], [address], [place], [place] [address]   Blood Alcohol level:  Lab Results  Component Value Date   [facility] <15 01/29/2024   Metabolic Disorder Labs: No results found for: HGBA1C, MPG No results found for: PROLACTIN Lab Results  Component Value Date   CHOL 214 (H) 02/01/2024   TRIG 91 02/01/2024   HDL 70 02/01/2024   CHOLHDL 3.1 02/01/2024   VLDL 18 02/01/2024   LDLCALC 126 (H) 02/01/2024   Physical Findings: AIMS:  ,  ,  ,  ,  ,  ,   CIWA:    COWS:     Musculoskeletal: Strength & Muscle Tone: within normal limits Gait & Station: normal Patient leans: N/A  Psychiatric Specialty Exam:  Presentation  General Appearance:  Appropriate for Environment; Disheveled  Eye Contact: Good  Speech: Clear and Coherent;  Normal Rate  Speech Volume: Normal  Handedness: Right   Mood and Affect  Mood: -- (Patient currently denies any symptopms of depression or anxiety.)  Affect: Congruent   Thought Process  Thought Processes: Coherent  Descriptions of Associations:Intact  Orientation:Full (Time, Place and Person)  Thought Content:Logical  History of Schizophrenia/Schizoaffective disorder:No data recorded Duration of Psychotic Symptoms:No data recorded Hallucinations:Hallucinations: None  Ideas of Reference:None  Suicidal Thoughts:Suicidal Thoughts: No  Homicidal Thoughts:Homicidal Thoughts: No   Sensorium  Memory: Immediate Good; Recent Good; Remote Good  Judgment: Poor  Insight: Poor   Executive Functions  Concentration: Fair  Attention Span: Fair  Recall: Fair  Fund of Knowledge: Fair  Language: Good  Psychomotor Activity  Psychomotor Activity: Psychomotor Activity: Normal  Assets  Assets: Communication  Skills; Housing; Physical Health; Social Support  Sleep  Sleep: Sleep: Good Number of Hours of Sleep: 7.5  Physical Exam: Physical Exam Vitals and nursing note reviewed.  HENT:     Head: Normocephalic.     Nose: Nose normal.     Mouth/Throat:     Pharynx: Oropharynx is clear.  Eyes:     Pupils: Pupils are equal, round, and reactive to light.  Cardiovascular:     Rate and Rhythm: Normal rate.     Pulses: Normal pulses.  Pulmonary:     Effort: Pulmonary effort is normal.  Genitourinary:    Comments: Deferred Musculoskeletal:        General: Normal range of motion.     Cervical back: Normal range of motion.  Skin:    General: Skin is dry.  Neurological:     General: No focal deficit present.     Mental Status: She is alert and oriented to person, place, and time.    Review of Systems  Constitutional:  Negative for chills, diaphoresis and fever.  HENT:  Negative for congestion and sore throat.   Respiratory:  Negative for cough, shortness of breath and wheezing.   Cardiovascular:  Negative for chest pain and palpitations.  Gastrointestinal:  Negative for abdominal pain, constipation, diarrhea, heartburn, nausea and vomiting.  Genitourinary:  Negative for dysuria.  Musculoskeletal:  Negative for joint pain and myalgias.  Neurological:  Negative for dizziness, tingling, tremors, sensory change, speech change, focal weakness, seizures, loss of consciousness, weakness and headaches.  Endo/Heme/Allergies:        NKDA.   Blood pressure 125/89, pulse 75, temperature (!) 97.3 F (36.3 C), temperature source Oral, resp. rate 16, height 5' 7 (1.702 m), weight 57 kg, SpO2 100%. Body mass index is 19.67 kg/m.  Treatment Plan Summary: Daily contact with patient to assess and evaluate symptoms and progress in treatment and Medication management.   Principal/active diagnoses.  MDD (major depressive disorder), recurrent severe, without psychosis.  Plan: The  risks/benefits/side-effects/alternatives to the medications in use were discussed in detail with the patient and time was given for patient's questions. The patient consents to medication trial.    -Continue Hydroxyzine  25 mg po qid prn for anxiety.  -Continue Trazodone  50 mg po Q hs prn for insomnia.    Agitation protocols.  -Continue as recommended (see MAR).   Other PRNS -Continue Tylenol  650 mg every 6 hours PRN for mild pain -Continue Maalox 30 ml Q 4 hrs PRN for indigestion -Continue MOM 30 ml po Q 6 hrs for constipation   Safety and Monitoring: Voluntary admission to inpatient psychiatric unit for safety, stabilization and treatment Daily contact with patient to assess and evaluate symptoms and progress in treatment  Patient's case to be discussed in multi-disciplinary team meeting Observation Level : q15 minute checks Vital signs: q12 hours Precautions: Safety   Discharge Planning: Social work and case management to assist with discharge planning and identification of hospital follow-up needs prior to discharge Estimated LOS: 5-7 days Discharge Concerns: Need to establish a safety plan; Medication compliance and effectiveness Discharge Goals: Return home with outpatient referrals for mental health follow-up including medication management/psychotherapy  Mac Bolster, NP, pmhnp, fnp-bc. 02/01/2024, 10:32 AM

## 2024-02-01 NOTE — Group Note (Signed)
 LCSW Group Therapy Note   Group Date: 02/01/2024 Start Time: 1100 End Time: 1200   Participation:  patient was present and actively participated in the discussion  Type of Therapy:  Group Therapy  Topic:  Healing Hearts:  A Safe Space for Grief  Objective:  The objective of this class, Healing Hearts: A Safe Space for Grief, is to create a compassionate environment where participants can process their grief, explore different stages of grief, and discover ways to honor their loved ones through personal rituals.  3 Goals: Provide a safe and supportive space where participants feel comfortable sharing their feelings and experiences of grief without judgment. Educate participants about the stages of grief and emphasize that there is no right way to grieve or a fixed timeline for healing. Introduce the concept of rituals as a means to process grief, allowing individuals to honor their loved ones in a personal and meaningful way.  Summary:  In Healing Hearts: A Safe Space for Grief, we explored the unique and personal journey of grief, emphasizing that everyone experiences it differently. We discussed the five stages of grief (denial, anger, bargaining, depression, and acceptance), with the understanding that grief is not linear. Rituals were introduced as a way to help cope with loss, offering comfort and connection through meaningful actions such as lighting candles or taking memory walks. Participants were encouraged to express their emotions, focus on self-care, and reflect on moments of gratitude for their loved ones, recognizing that healing is a process and there is no timeline for grief.  Therapeutic Modalities: Psychoeducation:  5 Stages of Grief Elements of DBT:  mindfulness and grounding for distress tolerance   Akram Kissick O Ron Junco, LCSWA 02/01/2024  12:59 PM

## 2024-02-01 NOTE — [facility] Group Notes (Signed)
 Adult Psychoeducational Group Note  Date:  02/01/2024 Time:  10:02 AM  Group Topic/Focus:  Goals Group:   The focus of this group is to help patients establish daily goals to achieve during treatment and discuss how the patient can incorporate goal setting into their daily lives to aide in recovery. Orientation:   The focus of this group is to educate the patient on the purpose and policies of crisis stabilization and provide a format to answer questions about their admission.  The group details unit policies and expectations of patients while admitted.  Participation Level:  Active  Participation Quality:  Appropriate  Affect:  Appropriate  Cognitive:  Alert  Insight: Appropriate  Engagement in Group:  Engaged  Modes of Intervention:  Orientation  Additional Comments:  Pt goal for today is to be a better version of herself.   Niel CHRISTELLA Nightingale 02/01/2024, 10:02 AM

## 2024-02-01 NOTE — Progress Notes (Signed)
   02/01/24 0800  Psych Admission Type (Psych Patients Only)  Admission Status Involuntary  Psychosocial Assessment  Patient Complaints Hyperactivity  Eye Contact Fair  Facial Expression Flat  Affect Appropriate to circumstance  Speech Rapid  Interaction Assertive  Motor Activity Other (Comment) (WNL for pt)  Appearance/Hygiene In scrubs  Behavior Characteristics Cooperative  Mood Pleasant  Thought Process  Coherency Circumstantial  Content Blaming others  Delusions None reported or observed  Perception WDL  Hallucination None reported or observed  Judgment WDL  Confusion None  Danger to Self  Current suicidal ideation? Denies  Danger to Others  Danger to Others None reported or observed

## 2024-02-01 NOTE — Group Note (Signed)
 Occupational Therapy Group Note  Group Topic: Sleep Hygiene  Group Date: 02/01/2024 Start Time: 1500 End Time: 1530 Facilitators: Dot [place] MATSU, OT   Group Description: Group encouraged increased participation and engagement through topic focused on sleep hygiene. Patients reflected on the quality of sleep they typically receive and identified areas that need improvement. Group was given background information on sleep and sleep hygiene, including common sleep disorders. Group members also received information on how to improve one's sleep and introduced a sleep diary as a tool that can be utilized to track sleep quality over a length of time. Group session ended with patients identifying one or more strategies they could utilize or implement into their sleep routine in order to improve overall sleep quality.        Therapeutic Goal(s):  Identify one or more strategies to improve overall sleep hygiene  Identify one or more areas of sleep that are negatively impacted (sleep too much, too little, etc)     Participation Level: Engaged   Participation Quality: Independent   Behavior: Hyperverbal   Speech/Thought Process: Loose association    Affect/Mood: Appropriate   Insight: Fair   Judgement: Fair      Modes of Intervention: Education  Patient Response to Interventions:  Attentive   Plan: Continue to engage patient in OT groups 2 - 3x/week.  02/01/2024  [place] MATSU Dot, OT Sharon Schmitt, OT

## 2024-02-02 DIAGNOSIS — F332 Major depressive disorder, recurrent severe without psychotic features: Principal | ICD-10-CM

## 2024-02-02 MED ORDER — FLUTICASONE PROPIONATE 50 MCG/ACT NA SUSP
1.0000 | Freq: Every day | NASAL | Status: AC
Start: 2024-02-03 — End: ?

## 2024-02-02 MED ORDER — TRAZODONE HCL 50 MG PO TABS: 50.0000 mg | ORAL_TABLET | Freq: Every evening | ORAL | 0 refills | Status: AC | PRN

## 2024-02-02 NOTE — [facility] Group Notes (Signed)
 [facility] Group Notes:  (Nursing/MHT/Case Management/Adjunct)  Date:  02/02/2024  Time:  12:02 AM  Type of Therapy:  Wrap-up gorup  Participation Level:  Active  Participation Quality:  Appropriate  Affect:  Appropriate  Cognitive:  Appropriate  Insight:  Appropriate  Engagement in Group:  Engaged  Modes of Intervention:  Education  Summary of Progress/Problems: Goal to interact with others. Rated day 10/10.   Sharon Schmitt 02/02/2024, 12:02 AM

## 2024-02-02 NOTE — Progress Notes (Signed)
  [facility] Adult Case Management Discharge Plan :  Will you be returning to the same living situation after discharge:  Yes,  pt going to live with her god-sister. At discharge, do you have transportation home?: Yes,  pt will be transported home via taxi voucher.  Do you have the ability to pay for your medications: Yes,  pt reported receiving financial assistance from family.  Release of information consent forms completed and in the chart;  Patient's signature needed at discharge.  Patient to Follow up at:  Follow-up Information     [facility]. Go on 02/05/2024.   Why: You have a hospital follow up appointment on 02/05/24 at 10:00 am .  The appointment will be held in person.  Following this appointment, you will be scheduled for a clinical assessment to obtain necessary therapy and medication management services. Contact information: [address] [place] [address] [phone]                 Next level of care provider has access to [facility]:no  Safety Planning and Suicide Prevention discussed: Yes,  completed with Joy (god-sister) [phone] and Brook Geraci (mother) [phone].     Has patient been referred to the Quitline?: Patient refused referral for treatment  Patient has been referred for addiction treatment: Patient refused referral for treatment.  Raylyn Carton M Jamell Laymon, LCSWA 02/02/2024, 12:02 PM

## 2024-02-02 NOTE — Discharge Summary (Signed)
 Physician Discharge Summary Note  Patient:  Sharon Schmitt is an 43 y.o., female  MRN:  [id number]  DOB:  [birth year]  Patient phone:  [phone] (home)   Patient address:   [address] [place] [address],   Total Time spent with patient: 45 minutes  Date of Admission:  01/30/2024  Date of Discharge: 02-02-24  Reason for Admission: Suicidal ideations suspected after patient posted a message on social media saying no body will hear from her anymore.  Principal Problem: MDD (major depressive disorder), recurrent severe, without psychosis (HCC)  Discharge Diagnoses: Principal Problem:   MDD (major depressive disorder), recurrent severe, without psychosis (HCC)  Past Psychiatric History: See H&P.  Past Medical History: History reviewed. No pertinent past medical history. History reviewed. No pertinent surgical history.  Family History: History reviewed. No pertinent family history.  Family Psychiatric  History: See H&P.  Social History:  Social History   Substance and Sexual Activity  Alcohol Use Yes   Alcohol/week: 2.0 standard drinks of alcohol   Types: 2 Cans of beer per week   Comment: last use 12/24/22 2x/mo     Social History   Substance and Sexual Activity  Drug Use Not Currently   Types: Marijuana   Comment: last use 43 yo    Social History   Socioeconomic History   Marital status: Single    Spouse name: Not on file   Number of children: Not on file   Years of education: Not on file   Highest education level: Not on file  Occupational History   Not on file  Tobacco Use   Smoking status: Some Days    Types: Cigarettes   Smokeless tobacco: Never  Substance and Sexual Activity   Alcohol use: Yes    Alcohol/week: 2.0 standard drinks of alcohol    Types: 2 Cans of beer per week    Comment: last use 12/24/22 2x/mo   Drug use: Not Currently    Types: Marijuana    Comment: last use 43 yo   Sexual activity: Yes    Partners: Male  Other  Topics Concern   Not on file  Social History Narrative   Not on file   Social Drivers of Health   Financial Resource Strain: Not on file  Food Insecurity: No Food Insecurity (01/30/2024)   Hunger Vital Sign    Worried About Running Out of Food in the Last Year: Never true    Ran Out of Food in the Last Year: Never true  Transportation Needs: No Transportation Needs (01/30/2024)   PRAPARE - Administrator, Civil Service (Medical): No    Lack of Transportation (Non-Medical): No  Physical Activity: Not on file  Stress: Not on file  Social Connections: Not on file   Hospital Course: (Per admission evaluation notes): 89 yesr old AA female with probable hx of mental illness & substance use disorders. Admitted to the [facility] from the [facility] with complaint of suicidal ideations after she posted a message on social media saying no body will hear from her anymore. Apparently, this patient suffered a miscarriage 3-4 months ago. Patient's mother involuntarily committed patient to the hospital for psychiatric evaluation because of patient's social media post.    Upon the decision by her treatment team to discharge Sharon Schmitt today, she was seen & evaluated  by her treatment team for mood stability. The current laboratory findings were reviewed, stable. The nurses notes & vital signs were reviewed as well. All are stable.  At this present time, there are no current mental health or medical issues that should prevent this discharge at this time. Patient is being discharged to continue mental health care & medication management as noted below. She is also aware & agreeable to this discharge.  Although with what seemed like a probable hx of mental health issues & polysubstance use disorders, this is Sharon Schmitt's first psychiatric admission/discharge summary from this [facility]. She was admitted with complaint of suicidal ideations suspected after patient posted a message on social media saying no body will hear from  her anymore. This social media post got her mother scared/concerned & Sharon Schmitt was involuntarily committed to the psychiatric hospital for evaluation/treatments. She was recommended for mood stabilization treatments by her treatment team after her admission evaluation. However, Sharon Schmitt declined treatment for any mental health issues. She did ask for medication to manage her ongoing insomnia only. She was enrolled & participated in the group counseling sessions being offered & held on this unit. She learned coping skills. She presented other significant pre-existing medical conditions that required treatment or monitoring. She was resumed & discharged on all the pertinent medications used for those health issues. She tolerated her treatment regimen without any adverse effects or reactions reported. [name]'s insomniac symptoms responded well to her treatment regimen without any reported side effects or adverse reactions. She did not report or show any substance withdrawal symptoms throughout this hospitalization. Sharon Schmitt is currently mentally & medically stable. Although her mother was deeply concerned about her substance abuse/addictions & possible mental health issues, Sharon Schmitt adamantly declined to either be on any medications besides the medication to aid her sleep. She also declined to attend or participate in any long term substance abuse treatment program.   Sharon Schmitt currently presents mentally/medically stable to be discharged to continue further mental health care/medication management as noted below. She is agreeable that this discharge is warranted. During the course of her hospitalization, the 15-minute checks were adequate to ensure Sharon Schmitt's safety. Patient did not display any dangerous, violent or suicidal behavior on the unit.  She interacted with the other patients & staff appropriately. She participated appropriately in the group sessions/therapies. Her  Trazodone  for sleep was addressed &  adjusted to meet her needs. She was recommended for outpatient follow-up care & medication management upon discharge to assure her continuity of care.  At the time of this discharge, patient is not reporting any acute suicidal/homicidal ideations. She feels more confident about her self & mental health care. She currently denies any new issues or concerns. Education and supportive counseling provided throughout her hospital stay & upon discharge.   Today upon her discharge evaluation with her treatment team, Sharon Schmitt shares she is doing well. She denies any other specific concerns. She is sleeping well. Her appetite is good. She denies other physical complaints. She denies AH/VH, delusional thoughts or paranoia. She does not appear to be responding to any internal stimuli. She feels that her sleep medication has been helpful & is in agreement to continue her current treatment regimen as recommended. She was able to engage in safety planning including plan to return to [facility] or contact emergency services if she feels unable to maintain her own safety or the safety of others. Pt had no further questions, comments, or concerns. She is aware that her mother stated that she would not allow her to return home unless she entered/participated in a rehabilitation treatment program. She left [facility] with all personal belongings in no apparent distress. Transportation per taxi cab.  [facility] assisted with the taxi fare.  Physical Findings: AIMS:  , ,  ,  ,  ,  ,   CIWA:    COWS:     Musculoskeletal: Strength & Muscle Tone: within normal limits Gait & Station: normal Patient leans: N/A   Psychiatric Specialty Exam:  Presentation  General Appearance:  Appropriate for Environment; Casual  Eye Contact: Good  Speech: Clear and Coherent; Normal Rate  Speech Volume: Normal  Handedness: Right   Mood and Affect  Mood: Euthymic  Affect: Congruent   Thought Process  Thought Processes: Coherent;  Linear  Descriptions of Associations:Intact  Orientation:Full (Time, Place and Person)  Thought Content:Logical  History of Schizophrenia/Schizoaffective disorder: NA  Duration of Psychotic Symptoms: NA  Hallucinations:Hallucinations: None  Ideas of Reference:None  Suicidal Thoughts:Suicidal Thoughts: No  Homicidal Thoughts:Homicidal Thoughts: No   Sensorium  Memory: Immediate Good; Recent Good; Remote Good  Judgment: Good  Insight: Poor   Executive Functions  Concentration: Good  Attention Span: Good  Recall: Good  Fund of Knowledge: Fair  Language: Good   Psychomotor Activity  Psychomotor Activity: Psychomotor Activity: Normal   Assets  Assets: Communication Skills; Social Support   Sleep  Sleep: Sleep: Good  Estimated Sleeping Duration (Last 24 Hours): 4.25-6.00 hours   Physical Exam: Physical Exam Vitals and nursing note reviewed.  HENT:     Head: Normocephalic.     Nose: Nose normal.     Mouth/Throat:     Pharynx: Oropharynx is clear.  Cardiovascular:     Pulses: Normal pulses.  Pulmonary:     Effort: Pulmonary effort is normal.  Genitourinary:    Comments: Deferred Musculoskeletal:        General: Normal range of motion.     Cervical back: Normal range of motion.  Skin:    General: Skin is dry.  Neurological:     General: No focal deficit present.     Mental Status: She is alert and oriented to person, place, and time. Mental status is at baseline.    Review of Systems  Constitutional:  Negative for chills, diaphoresis and fever.  HENT:  Negative for congestion and sore throat.   Eyes:  Negative for blurred vision.  Respiratory:  Negative for cough, shortness of breath and wheezing.   Cardiovascular:  Negative for chest pain and palpitations.  Gastrointestinal:  Negative for abdominal pain, constipation, diarrhea, heartburn, nausea and vomiting.  Genitourinary:  Negative for dysuria.  Musculoskeletal:  Negative  for joint pain and myalgias.  Neurological:  Negative for dizziness, tingling, tremors, sensory change, speech change, focal weakness, seizures, loss of consciousness, weakness and headaches.  Endo/Heme/Allergies:        NKDA  Psychiatric/Behavioral:  Positive for substance abuse (Hx. alcohol.cocaine use disorders.). Negative for depression, hallucinations, memory loss and suicidal ideas. The patient has insomnia (Hx of (stable on medication).). The patient is not nervous/anxious.    Blood pressure (!) 151/88, pulse 82, temperature (!) 97.3 F (36.3 C), temperature source Oral, resp. rate 16, height 5' 7 (1.702 m), weight 57 kg, SpO2 100%. Body mass index is 19.67 kg/m.  Social History   Tobacco Use  Smoking Status Some Days   Types: Cigarettes  Smokeless Tobacco Never   Tobacco Cessation:  N/A, patient does not currently use tobacco products  Blood Alcohol level:  Lab Results  Component Value Date   [facility] <15 01/29/2024    Metabolic Disorder Labs:  Lab Results  Component Value Date   HGBA1C 5.1 02/01/2024  MPG 99.67 02/01/2024   No results found for: PROLACTIN Lab Results  Component Value Date   CHOL 214 (H) 02/01/2024   TRIG 91 02/01/2024   HDL 70 02/01/2024   CHOLHDL 3.1 02/01/2024   VLDL 18 02/01/2024   LDLCALC 126 (H) 02/01/2024    See Psychiatric Specialty Exam and Suicide Risk Assessment completed by Attending Physician prior to discharge.  Discharge destination:  Home  Is patient on multiple antipsychotic therapies at discharge:  No   Has Patient had three or more failed trials of antipsychotic monotherapy by history:  No  Recommended Plan for Multiple Antipsychotic Therapies: NA  Allergies as of 02/02/2024   No Known Allergies      Medication List     STOP taking these medications    cetirizine 10 MG tablet Commonly known as: ZYRTEC   chlorhexidine  0.12 % solution Commonly known as: PERIDEX    clindamycin  150 MG capsule Commonly known as:  Cleocin    cyclobenzaprine  5 MG tablet Commonly known as: FLEXERIL    HYDROcodone -acetaminophen  5-325 MG tablet Commonly known as: Norco   ibuprofen  600 MG tablet Commonly known as: ADVIL        TAKE these medications      Indication  fluticasone  50 MCG/ACT nasal spray Commonly known as: FLONASE  Place 1 spray into both nostrils daily. For allergies. Start taking on: February 03, 2024  Indication: Seasonal allergies.   traZODone  50 MG tablet Commonly known as: DESYREL  Take 1 tablet (50 mg total) by mouth at bedtime as needed for sleep.  Indication: Trouble Sleeping        Follow-up Information     [facility], [facility]. Go on 02/05/2024.   Why: You have a hospital follow up appointment on 02/05/24 at 10:00 am .  The appointment will be held in person.  Following this appointment, you will be scheduled for a clinical assessment to obtain necessary therapy and medication management services. Contact information: [address] [place] [address] [phone]                Plan Of Care/Follow-up recommendations:  Activity: as tolerated Diet: heart healthy  Other: -Follow-up with your outpatient psychiatric provider -instructions on appointment date, time, and address (location) are provided to you in discharge paperwork.  -Take your psychiatric medications as prescribed at discharge - instructions are provided to you in the discharge paperwork  -Follow-up with outpatient primary care doctor and other specialists -for management of preventative medicine and chronic medical issues  -Testing: Follow-up with outpatient provider for abnormal lab results: NA  -If you are prescribed an atypical antipsychotic medication, we recommend that your outpatient psychiatrist follow routine screening for side effects within 3 months of discharge, including monitoring: AIMS scale, height, weight, blood pressure, fasting lipid panel, HbA1c, and fasting blood sugar.    -Recommend total abstinence from alcohol, tobacco, and other illicit drug use at discharge.   -If your psychiatric symptoms recur, worsen, or if you have side effects to your psychiatric medications, call your outpatient psychiatric provider, 911, [phone] or go to the nearest emergency department.  -If suicidal thoughts occur, immediately call your outpatient psychiatric provider, 911, [phone] or go to the nearest emergency department.  Signed: Mac Bolster, NP, pmhnp, fnpbc. 02/02/2024, 9:55 AM

## 2024-02-02 NOTE — [facility] Suicide Risk Assessment (Signed)
 Suicide Risk Assessment  Discharge Assessment    [facility] Discharge Suicide Risk Assessment   Principal Problem: MDD (major depressive disorder), recurrent severe, without psychosis (HCC)  Discharge Diagnoses: Principal Problem:   MDD (major depressive disorder), recurrent severe, without psychosis (HCC)  Total Time spent with patient: 45 minutes  Musculoskeletal: Strength & Muscle Tone: within normal limits Gait & Station: normal Patient leans: N/A  Psychiatric Specialty Exam  Presentation  General Appearance:  Appropriate for Environment; Casual  Eye Contact: Good  Speech: Clear and Coherent; Normal Rate  Speech Volume: Normal  Handedness: Right   Mood and Affect  Mood: Euthymic  Duration of Depression Symptoms: No data recorded Affect: Congruent   Thought Process  Thought Processes: Coherent; Linear  Descriptions of Associations:Intact  Orientation:Full (Time, Place and Person)  Thought Content:Logical  History of Schizophrenia/Schizoaffective disorder:No data recorded Duration of Psychotic Symptoms:No data recorded Hallucinations:Hallucinations: None  Ideas of Reference:None  Suicidal Thoughts:Suicidal Thoughts: No  Homicidal Thoughts:Homicidal Thoughts: No   Sensorium  Memory: Immediate Good; Recent Good; Remote Good  Judgment: Good  Insight: Poor   Executive Functions  Concentration: Good  Attention Span: Good  Recall: Good  Fund of Knowledge: Fair  Language: Good  Psychomotor Activity  Psychomotor Activity:Psychomotor Activity: Normal  Assets  Assets: Communication Skills; Social Support   Sleep  Sleep:Sleep: Good  Estimated Sleeping Duration (Last 24 Hours): 4.25-6.00 hours  Physical Exam: See the discharge summary.  Blood pressure (!) 151/88, pulse 82, temperature (!) 97.3 F (36.3 C), temperature source Oral, resp. rate 16, height 5' 7 (1.702 m), weight 57 kg, SpO2 100%. Body mass index is 19.67  kg/m.  Mental Status Per Nursing Assessment::   On Admission:  NA  Demographic Factors:  Adolescent or young adult, Low socioeconomic status, and Unemployed  Loss Factors: Financial problems/change in socioeconomic status  Historical Factors: Impulsivity  Risk Reduction Factors:   Sense of responsibility to family and Living with another person, especially a relative  Continued Clinical Symptoms:  Alcohol/Substance Abuse/Dependencies  Cognitive Features That Contribute To Risk:  Thought constriction (tunnel vision)    Suicide Risk:  Minimal: No identifiable suicidal ideation.  Patients presenting with no risk factors but with morbid ruminations; may be classified as minimal risk based on the severity of the depressive symptoms   Follow-up Information     [facility] [place]. Go on 02/05/2024.   Why: You have a hospital follow up appointment on 02/05/24 at 10:00 am .  The appointment will be held in person.  Following this appointment, you will be scheduled for a clinical assessment to obtain necessary therapy and medication management services. Contact information: [address] [place] [address] [phone]                Plan Of Care/Follow-up recommendations:  See the discharge recommendation above.  Mac Bolster, NP, pmhnp, fnp-bc. 02/02/2024, 9:48 AM

## 2024-02-02 NOTE — Group Note (Signed)
 Recreation Therapy Group Note   Group Topic:Team Building  Group Date: 02/02/2024 Start Time: 0935 End Time: 1005 Facilitators: Ronique Simerly-McCall, LRT,CTRS Location: [address] Dayroom   Group Topic: Communication, Team Building, Problem Solving  Goal Area(s) Addresses:  Patient will effectively work with peer towards shared goal.  Patient will identify skills used to make activity successful.  Patient will share challenges and verbalize solution-driven approaches used. Patient will identify how skills used during activity can be used to reach post d/c goals.   Behavioral Response: Moderate  Intervention: STEM Activity   Activity: [employer]. Patients were provided the following materials: 4 drinking straws, 5 rubber bands, 5 paper clips, 2 index cards and 2 drinking cups. Using the provided materials patients were asked to build a launching mechanism to launch a ping pong ball across the room, approximately 10 feet. Patients were divided into teams of 3-5. Instructions required all materials be incorporated into the device, functionality of items left to the peer group's discretion.  Education: Pharmacist, community, Scientist, physiological, Air cabin crew, Building control surveyor.   Education Outcome: Acknowledges education/In group clarification    Affect/Mood: Appropriate   Participation Level: Moderate   Participation Quality: Independent   Behavior: Appropriate   Speech/Thought Process: Relevant   Insight: Good   Judgement: Good   Modes of Intervention: STEM Activity   Patient Response to Interventions:  Attentive   Education Outcome:  In group clarification offered    Clinical Observations/Individualized Feedback: Pt was excited to be going home today. In the mist of her excitement, pt offered suggestions on how the group should make their launcher. Pt was also attentive to the suggestions of others as well.      Plan: Continue to engage patient in RT group sessions  2-3x/week.   Izear Pine-McCall, LRT,CTRS 02/02/2024 11:53 AM

## 2024-02-02 NOTE — Progress Notes (Signed)
 Pt discharged to lobby. Pt was stable and appreciative at that time. All papers and prescriptions were given and valuables returned. Verbal understanding expressed. Denies SI/HI and A/VH. Pt given opportunity to express concerns and ask questions.

## 2024-02-02 NOTE — Plan of Care (Signed)
   Problem: Education: Goal: Knowledge of [facility] Education information/materials will improve Outcome: Progressing Goal: Emotional status will improve Outcome: Progressing Goal: Mental status will improve Outcome: Progressing Goal: Verbalization of understanding the information provided will improve Outcome: Progressing

## 2024-02-02 NOTE — Group Note (Signed)
 Date:  02/02/2024 Time:  9:07 AM  Group Topic/Focus:  Goals Group:   The focus of this group is to help patients establish daily goals to achieve during treatment and discuss how the patient can incorporate goal setting into their daily lives to aide in recovery.    Participation Level:  Did Not Attend  Participation Quality:  na  Affect:  na  Cognitive:  na  Insight: None  Engagement in Group:  na  Modes of Intervention:  na  Additional Comments:  na  Nat Rummer 02/02/2024, 9:07 AM

## 2024-02-02 NOTE — Transportation (Signed)
 02/02/2024  Veleria POUR Allshouse DOB: 1980/07/30 MRN: [id number]   RIDER WAIVER AND RELEASE OF LIABILITY  For the purposes of helping with transportation needs, [facility] partners with outside transportation providers (taxi companies, [place] [employer], Catering manager.) to give [facility] patients or other approved people the choice of on-demand rides Public librarian) to our buildings for non-emergency visits.  By using [employer], I, the person signing this document, on behalf of myself and/or any legal minors (in my care using the [employer]), agree:  Science writer given to me are supplied by independent, outside transportation providers who do not work for, or have any affiliation with, [employer]. [facility] [facility] is not a transportation company. [facility] has no control over the quality or safety of the rides I get using [employer]. [facility] has no control over whether any outside ride will happen on time or not. [facility] gives no guarantee on the reliability, quality, safety, or availability on any rides, or that no mistakes will happen. I know and accept that traveling by vehicle (car, truck, SVU, fleeta, bus, taxi, etc.) has risks of serious injuries such as disability, being paralyzed, and death. I know and agree the risk of using [employer] is mine alone, and not [employer]. [employer] are provided as is and as are available. The transportation providers are in charge for all inspections and care of the vehicles used to provide these rides. I agree not to take legal action against [facility], its agents, employees, officers, directors, representatives, insurers, attorneys, assigns, successors, subsidiaries, and affiliates at any time for any reasons related directly or indirectly to using [employer]. I also agree not to take legal action against [facility] or its affiliates for any injury, death, or damage to property caused by or related to using  [employer]. I have read this Waiver and Release of Liability, and I understand the terms used in it and their legal meaning. This Waiver is freely and voluntarily given with the understanding that my right (or any legal minors) to legal action against [facility] relating to [employer] is knowingly given up to use these services.   I attest that I read the Ride Waiver and Release of Liability to Veleria POUR Favor, gave Ms. Darko the opportunity to ask questions and answered the questions asked (if any). I affirm that Aretta K Pulice then provided consent for assistance with transportation.        Brennyn Ortlieb, LCSWA 02/02/24 10:05am

## 2024-03-01 ENCOUNTER — Ambulatory Visit (LOCAL_COMMUNITY_HEALTH_CENTER): Payer: Self-pay

## 2024-03-01 VITALS — BP 133/79 | HR 91 | Wt 132.4 lb

## 2024-03-01 DIAGNOSIS — N926 Irregular menstruation, unspecified: Secondary | ICD-10-CM

## 2024-03-01 DIAGNOSIS — Z3202 Encounter for pregnancy test, result negative: Secondary | ICD-10-CM

## 2024-03-01 DIAGNOSIS — Z1231 Encounter for screening mammogram for malignant neoplasm of breast: Secondary | ICD-10-CM

## 2024-03-01 DIAGNOSIS — Z3009 Encounter for other general counseling and advice on contraception: Secondary | ICD-10-CM

## 2024-03-01 DIAGNOSIS — Z113 Encounter for screening for infections with a predominantly sexual mode of transmission: Secondary | ICD-10-CM

## 2024-03-01 DIAGNOSIS — Z124 Encounter for screening for malignant neoplasm of cervix: Secondary | ICD-10-CM

## 2024-03-01 LAB — WET PREP FOR TRICH, YEAST, CLUE
Clue Cell Exam: NEGATIVE
Trichomonas Exam: NEGATIVE
Yeast Exam: NEGATIVE

## 2024-03-01 LAB — HM HIV SCREENING LAB: HM HIV Screening: NEGATIVE

## 2024-03-01 LAB — PREGNANCY, URINE: Preg Test, Ur: NEGATIVE
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
## 2024-03-01 NOTE — Progress Notes (Signed)
 Pt is here for PE. Wet prep reviewed with patient and requires no treatment per provider. Condoms and brochure given. Wilkie Drought, RN.

## 2024-03-05 ENCOUNTER — Ambulatory Visit: Payer: Self-pay

## 2024-03-05 LAB — IGP, APTIMA HPV
HPV Aptima: NEGATIVE
PAP Smear Comment: 0

## 2024-03-05 NOTE — Progress Notes (Signed)
 Normal pap test with negative HPV. Follow up in 5 years (2030).  Sharon Schmitt [facility]

## 2024-06-20 ENCOUNTER — Ambulatory Visit: Payer: Self-pay | Admitting: Family Medicine

## 2024-06-20 DIAGNOSIS — A599 Trichomoniasis, unspecified: Secondary | ICD-10-CM

## 2024-06-20 DIAGNOSIS — Z113 Encounter for screening for infections with a predominantly sexual mode of transmission: Secondary | ICD-10-CM

## 2024-06-20 LAB — WET PREP FOR TRICH, YEAST, CLUE
Clue Cell Exam: NEGATIVE
Trichomonas Exam: POSITIVE — AB
Yeast Exam: NEGATIVE

## 2024-06-20 MED ORDER — METRONIDAZOLE 500 MG PO TABS: 500.0000 mg | ORAL_TABLET | Freq: Two times a day (BID) | ORAL | Status: AC

## 2024-06-20 NOTE — Progress Notes (Signed)
 Pt is here for STD screening. Wet prep results reviewed with patient and was dispensed metronidazole  500 mg tablets 2x/day for 7 days.Contact card, brochure and condoms given. Kwadwo Teletha Petrea,RN.
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
